# Patient Record
Sex: Female | Born: 1950 | Race: White | Hispanic: No | Marital: Married | Smoking: Former smoker
Health system: Southern US, Community
[De-identification: ages and names within clinical notes are randomized; demographics above are authoritative.]

## PROBLEM LIST (undated history)

## (undated) ENCOUNTER — Emergency Department: Discharge status: Absent without leave | Payer: Self-pay

## (undated) DIAGNOSIS — J189 Pneumonia, unspecified organism: Secondary | ICD-10-CM

## (undated) DIAGNOSIS — K5792 Diverticulitis of intestine, part unspecified, without perforation or abscess without bleeding: Secondary | ICD-10-CM

## (undated) DIAGNOSIS — K589 Irritable bowel syndrome without diarrhea: Secondary | ICD-10-CM

## (undated) DIAGNOSIS — D8989 Other specified disorders involving the immune mechanism, not elsewhere classified: Secondary | ICD-10-CM

## (undated) DIAGNOSIS — K449 Diaphragmatic hernia without obstruction or gangrene: Secondary | ICD-10-CM

## (undated) DIAGNOSIS — K219 Gastro-esophageal reflux disease without esophagitis: Secondary | ICD-10-CM

## (undated) DIAGNOSIS — I7090 Unspecified atherosclerosis: Secondary | ICD-10-CM

## (undated) DIAGNOSIS — J40 Bronchitis, not specified as acute or chronic: Secondary | ICD-10-CM

## (undated) DIAGNOSIS — M810 Age-related osteoporosis without current pathological fracture: Secondary | ICD-10-CM

## (undated) HISTORY — DX: Gastro-esophageal reflux disease without esophagitis: K21.9

## (undated) HISTORY — DX: Unspecified atherosclerosis: I70.90

## (undated) HISTORY — DX: Age-related osteoporosis without current pathological fracture: M81.0

## (undated) HISTORY — PX: TUBAL LIGATION: SHX77

## (undated) HISTORY — DX: Irritable bowel syndrome, unspecified: K58.9

## (undated) HISTORY — DX: Diaphragmatic hernia without obstruction or gangrene: K44.9

## (undated) HISTORY — DX: Other specified disorders involving the immune mechanism, not elsewhere classified: D89.89

## (undated) HISTORY — DX: Diverticulitis of intestine, part unspecified, without perforation or abscess without bleeding: K57.92

---

## 1998-04-02 ENCOUNTER — Other Ambulatory Visit: Admission: RE | Admit: 1998-04-02 | Discharge: 1998-04-02 | Payer: Self-pay | Admitting: Obstetrics & Gynecology

## 1999-05-08 ENCOUNTER — Other Ambulatory Visit: Admission: RE | Admit: 1999-05-08 | Discharge: 1999-05-08 | Payer: Self-pay | Admitting: Obstetrics & Gynecology

## 1999-09-23 ENCOUNTER — Other Ambulatory Visit: Admission: RE | Admit: 1999-09-23 | Discharge: 1999-09-23 | Payer: Self-pay | Admitting: Obstetrics & Gynecology

## 1999-11-05 ENCOUNTER — Other Ambulatory Visit: Admission: RE | Admit: 1999-11-05 | Discharge: 1999-11-05 | Payer: Self-pay | Admitting: Obstetrics & Gynecology

## 1999-11-05 ENCOUNTER — Encounter (INDEPENDENT_AMBULATORY_CARE_PROVIDER_SITE_OTHER): Payer: Self-pay | Admitting: Specialist

## 2000-05-13 ENCOUNTER — Other Ambulatory Visit: Admission: RE | Admit: 2000-05-13 | Discharge: 2000-05-13 | Payer: Self-pay | Admitting: Obstetrics & Gynecology

## 2001-06-16 ENCOUNTER — Other Ambulatory Visit: Admission: RE | Admit: 2001-06-16 | Discharge: 2001-06-16 | Payer: Self-pay | Admitting: Obstetrics & Gynecology

## 2002-10-24 ENCOUNTER — Other Ambulatory Visit: Admission: RE | Admit: 2002-10-24 | Discharge: 2002-10-24 | Payer: Self-pay | Admitting: Obstetrics & Gynecology

## 2003-11-07 ENCOUNTER — Other Ambulatory Visit: Admission: RE | Admit: 2003-11-07 | Discharge: 2003-11-07 | Payer: Self-pay | Admitting: Obstetrics & Gynecology

## 2004-12-25 ENCOUNTER — Other Ambulatory Visit: Admission: RE | Admit: 2004-12-25 | Discharge: 2004-12-25 | Payer: Self-pay | Admitting: Obstetrics & Gynecology

## 2005-01-09 ENCOUNTER — Encounter: Payer: Self-pay | Admitting: Pulmonary Disease

## 2005-01-09 ENCOUNTER — Encounter: Admission: RE | Admit: 2005-01-09 | Discharge: 2005-01-09 | Payer: Self-pay | Admitting: Orthopedic Surgery

## 2005-06-10 ENCOUNTER — Encounter: Payer: Self-pay | Admitting: Pulmonary Disease

## 2005-07-16 ENCOUNTER — Ambulatory Visit: Payer: Self-pay | Admitting: Pulmonary Disease

## 2005-07-18 ENCOUNTER — Ambulatory Visit (HOSPITAL_COMMUNITY): Admission: RE | Admit: 2005-07-18 | Discharge: 2005-07-18 | Payer: Self-pay | Admitting: Pulmonary Disease

## 2005-08-08 ENCOUNTER — Ambulatory Visit: Payer: Self-pay | Admitting: Pulmonary Disease

## 2005-08-12 ENCOUNTER — Encounter: Payer: Self-pay | Admitting: Pulmonary Disease

## 2005-08-12 ENCOUNTER — Ambulatory Visit (HOSPITAL_COMMUNITY): Admission: RE | Admit: 2005-08-12 | Discharge: 2005-08-12 | Payer: Self-pay | Admitting: Pulmonary Disease

## 2005-08-29 ENCOUNTER — Ambulatory Visit: Payer: Self-pay | Admitting: Pulmonary Disease

## 2005-11-18 ENCOUNTER — Ambulatory Visit: Payer: Self-pay | Admitting: Internal Medicine

## 2005-12-02 ENCOUNTER — Ambulatory Visit: Payer: Self-pay | Admitting: Pulmonary Disease

## 2006-02-17 ENCOUNTER — Ambulatory Visit: Payer: Self-pay | Admitting: Pulmonary Disease

## 2006-02-19 ENCOUNTER — Encounter: Payer: Self-pay | Admitting: Pulmonary Disease

## 2006-03-13 ENCOUNTER — Encounter: Payer: Self-pay | Admitting: Pulmonary Disease

## 2006-12-07 DIAGNOSIS — J984 Other disorders of lung: Secondary | ICD-10-CM | POA: Insufficient documentation

## 2006-12-07 DIAGNOSIS — R071 Chest pain on breathing: Secondary | ICD-10-CM | POA: Insufficient documentation

## 2006-12-07 DIAGNOSIS — J309 Allergic rhinitis, unspecified: Secondary | ICD-10-CM | POA: Insufficient documentation

## 2007-02-02 ENCOUNTER — Ambulatory Visit: Payer: Self-pay | Admitting: Pulmonary Disease

## 2007-02-02 DIAGNOSIS — J479 Bronchiectasis, uncomplicated: Secondary | ICD-10-CM

## 2008-05-25 ENCOUNTER — Ambulatory Visit: Payer: Self-pay | Admitting: Pulmonary Disease

## 2010-03-03 LAB — CONVERTED CEMR LAB
Rhuematoid fact SerPl-aCnc: <20 intl units/mL — ABNORMAL LOW (ref 0.0–20.0)
Sed Rate: 18 mm/hr (ref 0–25)

## 2010-06-21 NOTE — Assessment & Plan Note (Signed)
[facility]                               PULMONARY OFFICE NOTE   Alexandra, Rodriguez                      MRN:          [id number]  DATE:11/18/2005                            DOB:          09/26/1950    HISTORY OF PRESENT ILLNESS:  Patient is a 60 year old white female patient  of Dr. Shelle Iron who has a history of bronchiectasis, presents for an acute  office visit.  Patient complains of 2 week history of upper respiratory  symptoms with nasal congestion, postnasal drip, cough and chest tightness.  Patient denies any hemoptysis, orthopnea, PND or recent travel.  Patient did  have a similar episode in July of this year  and received a 10 day course of  Augmentin and that improved symptoms.  A followup CT chest at that time  showed improved area of right middle lobe consolidation and stable lung  nodules.   PAST MEDICAL HISTORY:  Reviewed.   CURRENT MEDICATIONS:  Reviewed.   PHYSICAL EXAMINATION:  GENERAL;  Patient is a pleasant female in no acute  distress.  VITAL SIGNS:  She is afebrile with stable vital signs.  O2 saturation is  100% on room air.  HEENT:  Nasal mucosa with mild erythema, nontender sinuses, posterior  pharynx clear.  NECK:  Supple with no adenopathy.  LUNGS:  Sounds are clear to auscultation bilaterally without any wheezes or  crackles.  CARDIAC:  Regular rate and rhythm.  ABDOMEN:  Soft.  EXTREMITIES:  Warm without any edema.   IMPRESSION/PLAN:  Acute upper respiratory infection with a mild  bronchiectatic flare.  Patient will be given Omnicef x10 days, Mucinex DM  twice a day,  Endal HD as needed for cough.  Patient may use Motrin as  needed. Patient is to return with Dr. Shelle Iron in 2 weeks or sooner if needed.      ______________________________  Rubye Oaks, NP    ______________________________  Barbaraann Share, MD,FCCP     TP/MedQ  DD:  11/18/2005  DT:  11/19/2005  Job #:  [id number]

## 2011-01-18 ENCOUNTER — Emergency Department
Admission: EM | Admit: 2011-01-18 | Discharge: 2011-01-18 | Disposition: A | Discharge status: Home / Self Care | Payer: BC Managed Care – PPO | Source: Home / Self Care | Attending: Emergency Medicine | Admitting: Emergency Medicine

## 2011-01-18 DIAGNOSIS — R05 Cough: Secondary | ICD-10-CM

## 2011-01-18 DIAGNOSIS — J209 Acute bronchitis, unspecified: Secondary | ICD-10-CM

## 2011-01-18 HISTORY — DX: Bronchitis, not specified as acute or chronic: J40

## 2011-01-18 MED ORDER — GUAIFENESIN-CODEINE 100-10 MG/5ML PO SYRP: 5.0000 mL | ORAL_SOLUTION | Freq: Four times a day (QID) | ORAL | Status: AC | PRN

## 2011-01-18 MED ORDER — AZITHROMYCIN 250 MG PO TABS: ORAL_TABLET | ORAL | Status: AC

## 2011-01-18 NOTE — ED Notes (Signed)
Cough on and off x 6 weeks, states she has a hx of bronchitis

## 2011-01-18 NOTE — ED Provider Notes (Signed)
History     CSN: [id number] Arrival date & time: No admission date for patient encounter.   First MD Initiated Contact with Patient 01/18/11 1502      No chief complaint on file.   (Consider location/radiation/quality/duration/timing/severity/associated sxs/prior treatment) HPI Charonda is a 60 y.o. female who complains of onset of cold symptoms off and on for 6 weeks. No sore throat + cough No pleuritic pain No wheezing No nasal congestion No post-nasal drainage No sinus pain/pressure + chest congestion No itchy/red eyes No earache No hemoptysis No SOB No chills/sweats No fever No nausea No vomiting No abdominal pain No diarrhea No skin rashes + fatigue No myalgias No headache    No past medical history on file.  No past surgical history on file.  No family history on file.  History  Substance Use Topics  . Smoking status: Not on file  . Smokeless tobacco: Not on file  . Alcohol Use: Not on file    OB History    No data available      Review of Systems  All other systems reviewed and are negative.    Allergies  Review of patient's allergies indicates not on file.  Home Medications  No current outpatient prescriptions on file.  There were no vitals taken for this visit.  Physical Exam  Nursing note and vitals reviewed. Constitutional: She is oriented to person, place, and time. She appears well-developed and well-nourished.  HENT:  Head: Normocephalic and atraumatic.  Right Ear: Tympanic membrane, external ear and ear canal normal.  Left Ear: Tympanic membrane, external ear and ear canal normal.  Mouth/Throat: No oropharyngeal exudate, posterior oropharyngeal edema or posterior oropharyngeal erythema.  Eyes: No scleral icterus.  Neck: Neck supple.  Cardiovascular: Regular rhythm and normal heart sounds.   Pulmonary/Chest: Effort normal and breath sounds normal. No respiratory distress.  Neurological: She is alert and oriented to person,  place, and time.  Skin: Skin is warm and dry.  Psychiatric: She has a normal mood and affect. Her speech is normal.    ED Course  Procedures (including critical care time)  Labs Reviewed - No data to display No results found.   1. Cough   2. Acute bronchitis       MDM  1)  Take the prescribed antibiotic as instructed. 2)  Use nasal saline solution (over the counter) at least 3 times a day. 3)  Use over the counter decongestants like Zyrtec-D every 12 hours as needed to help with congestion.  If you have hypertension, do not take medicines with sudafed.  4)  Can take tylenol every 6 hours or motrin every 8 hours for pain or fever. 5)  Follow up with your primary doctor if no improvement in 5-7 days, sooner if increasing pain, fever, or new symptoms.      Lily Kocher, MD 01/18/11 [phone]

## 2011-02-21 ENCOUNTER — Encounter: Payer: Self-pay | Admitting: *Deleted

## 2011-02-21 ENCOUNTER — Emergency Department
Admission: EM | Admit: 2011-02-21 | Discharge: 2011-02-21 | Disposition: A | Discharge status: Home / Self Care | Payer: BC Managed Care – PPO | Source: Home / Self Care | Attending: Emergency Medicine | Admitting: Emergency Medicine

## 2011-02-21 DIAGNOSIS — J0100 Acute maxillary sinusitis, unspecified: Secondary | ICD-10-CM

## 2011-02-21 MED ORDER — AMOXICILLIN-POT CLAVULANATE 875-125 MG PO TABS: 1.0000 | ORAL_TABLET | Freq: Two times a day (BID) | ORAL | Status: AC

## 2011-02-21 MED ORDER — PROMETHAZINE-DM 6.25-15 MG/5ML PO SYRP: ORAL_SOLUTION | ORAL | Status: AC

## 2011-02-21 MED ORDER — FLUTICASONE PROPIONATE 50 MCG/ACT NA SUSP: NASAL | Status: DC

## 2011-02-21 NOTE — ED Notes (Signed)
Pt c/o non producitve cough, nasal congestion, and sinus/ear pressure x 2 days. She has taken an old rx of cough med and ASA.

## 2011-02-21 NOTE — ED Provider Notes (Signed)
History     CSN: [id number]  Arrival date & time 02/21/11  1545   First MD Initiated Contact with Patient 02/21/11 1617      Chief Complaint  Patient presents with  . Cough  . Nasal Congestion    (Consider location/radiation/quality/duration/timing/severity/associated sxs/prior treatment) The history is provided by the patient.  SINUSITIS  Onset: 3 days   Severity: Severe  Better with: Nothing  Symptoms Cough: yes, minimally productive Runny nose: yes Fever: yes   Highest Temp: 101 Sinus Pressure: yes  Ears Blocked: Yes, pressure Teeth Ache: yes  Frontal Headache: no  Second sickening: no   PMH Sinusitis or Recurrent OM: no  PMH Prior Sinus or Ear Surgery: no  Recent antibiotic usage (last 30 days): no  PMH of Diabetes or Immunocompromise: no    Red flags Change in mental state: no Change in vision: no Rash: no     Past Medical History  Diagnosis Date  . Bronchitis   . Bronchitis     Past Surgical History  Procedure Date  . Tubal ligation   . Tubal ligation     Family History  Problem Relation Age of Onset  . Thyroid disease Mother     History  Substance Use Topics  . Smoking status: Former Games developer  . Smokeless tobacco: Not on file  . Alcohol Use: Yes     2-3 per wk    OB History    Grav Para Term Preterm Abortions TAB SAB Ect Mult Living                  Review of Systems  Allergies  Review of patient's allergies indicates no known allergies.  Home Medications   Current Outpatient Rx  Name Route Sig Dispense Refill  . AMOXICILLIN-POT CLAVULANATE 875-125 MG PO TABS Oral Take 1 tablet by mouth every 12 (twelve) hours. Take for 10 days. Take with food. 20 tablet 0  . FLUTICASONE PROPIONATE 50 MCG/ACT NA SUSP  1 or 2 sprays each nostril twice a day 16 g 0  . PROMETHAZINE-DM 6.25-15 MG/5ML PO SYRP  5 ml po every 4-6 hours as needed for cough 118 mL 0    BP 149/87  Pulse 102  Temp(Src) 99.4 F (37.4 C) (Oral)  Resp 18  Ht 5\' 7"   (1.702 m)  Wt 136 lb 12 oz (62.029 kg)  BMI 21.42 kg/m2  SpO2 99%  Physical Exam  Nursing note and vitals reviewed. Constitutional: She is oriented to person, place, and time. She appears well-developed and well-nourished. No distress.  HENT:  Head: Normocephalic and atraumatic.  Right Ear: Tympanic membrane, external ear and ear canal normal.  Left Ear: Tympanic membrane, external ear and ear canal normal.  Nose: Mucosal edema and rhinorrhea present. Right sinus exhibits maxillary sinus tenderness. Left sinus exhibits maxillary sinus tenderness.  Mouth/Throat: Oropharynx is clear and moist. No oral lesions. No oropharyngeal exudate.  Eyes: Right eye exhibits no discharge. Left eye exhibits no discharge. No scleral icterus.  Neck: Neck supple.  Cardiovascular: Normal rate, regular rhythm and normal heart sounds.   Pulmonary/Chest: Effort normal and breath sounds normal. She has no wheezes. She has no rales.  Lymphadenopathy:    She has no cervical adenopathy.  Neurological: She is alert and oriented to person, place, and time.  Skin: Skin is warm and dry.    ED Course  Procedures (including critical care time)  Labs Reviewed - No data to display No results found.   1. Acute  maxillary sinusitis       MDM  See detailed Instructions in AVS, which were given to patient. Verbal instructions also given. Questions invited and answered. Other symptomatic care discussed        Lonell Face, MD 02/21/11 [phone]

## 2011-06-28 ENCOUNTER — Emergency Department
Admission: EM | Admit: 2011-06-28 | Discharge: 2011-06-28 | Disposition: A | Discharge status: Home / Self Care | Payer: BC Managed Care – PPO | Source: Home / Self Care

## 2011-06-28 DIAGNOSIS — A770 Spotted fever due to Rickettsia rickettsii: Secondary | ICD-10-CM

## 2011-06-28 DIAGNOSIS — W57XXXA Bitten or stung by nonvenomous insect and other nonvenomous arthropods, initial encounter: Secondary | ICD-10-CM

## 2011-06-28 MED ORDER — DOXYCYCLINE HYCLATE 100 MG PO TABS: 100.0000 mg | ORAL_TABLET | Freq: Two times a day (BID) | ORAL | Status: AC

## 2011-06-28 NOTE — ED Provider Notes (Signed)
History     CSN: [id number]  Arrival date & time 06/28/11  1739   None     No chief complaint on file.  HPI Comments: Pt bit by tick 2 weeks ago.  Has developed localized rash.  Also some mild headache.  Fever:no Rash:no Myalgia:yes Nausea/Vomiting: no Malaise:yes Symptoms have been present for approx 5 days Has been using old rx of amoxicillin and asa for symptomatic treatment with minimal change in sxs.    The history is provided by the patient.    Past Medical History  Diagnosis Date  . Bronchitis   . Bronchitis     Past Surgical History  Procedure Date  . Tubal ligation   . Tubal ligation     Family History  Problem Relation Age of Onset  . Thyroid disease Mother     History  Substance Use Topics  . Smoking status: Former Games developer  . Smokeless tobacco: Not on file  . Alcohol Use: Yes     2-3 per wk    OB History    Grav Para Term Preterm Abortions TAB SAB Ect Mult Living                  Review of Systems  All other systems reviewed and are negative.    Allergies  Review of patient's allergies indicates no known allergies.  Home Medications   Current Outpatient Rx  Name Route Sig Dispense Refill  . FLUTICASONE PROPIONATE 50 MCG/ACT NA SUSP  1 or 2 sprays each nostril twice a day 16 g 0    There were no vitals taken for this visit.  Physical Exam  Constitutional: She appears well-developed and well-nourished.  HENT:  Head: Normocephalic and atraumatic.  Right Ear: External ear normal.  Left Ear: External ear normal.  Mouth/Throat: Oropharynx is clear and moist.  Eyes: Conjunctivae are normal. Pupils are equal, round, and reactive to light.  Neck: Normal range of motion. Neck supple.  Cardiovascular: Normal rate and regular rhythm.   Pulmonary/Chest: Effort normal and breath sounds normal.  Abdominal: Soft. Bowel sounds are normal.  Skin:          2 erythematous macules <0.5 cm near sites of previous tick exposure.  No bullseye  morphology    ED Course  Procedures (including critical care time)  Labs Reviewed - No data to display No results found.   No diagnosis found.    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  Will obtain RMSF titers, CBC, CMET Start on doxycycline Follow up with PCP/ [facility] in 1-2 weeks.  Handout given.  Infectious red flags discussed.     The patient and/or caregiver has been counseled thoroughly with regard to treatment plan and/or medications prescribed including dosage, schedule, interactions, rationale for use, and possible side effects and they verbalize understanding. Diagnoses and expected course of recovery discussed and will return if not improved as expected or if the condition worsens. Patient and/or caregiver verbalized understanding.             Floydene Flock, MD 06/28/11 [phone]

## 2011-06-28 NOTE — Discharge Instructions (Signed)
[place] Spotted Fever °[place] Spotted Fever (RMSF) is the oldest known tick-borne disease of people in the [place]. This disease was named because it was first described among people in the [place] area who had an illness characterized by a rash with red-purple-black spots. This disease is caused by a rickettsia (Rickettsia rickettsii), a bacteria carried by the tick. °The [place] wood tick and the [place] dog tick, acquire and transmit the RMSF bacteria (pictures NOT actual size). When a larval, nymphal or adult tick feeds on an infected rodent or larger animal, the tick can become infected. Infected adult ticks then feed on people who may then get RMSF. The tick transmits the disease to humans during a prolonged period of feeding that lasts many hours, days or even a couple weeks. The bite is painless and frequently goes unnoticed. An infected female tick may also pass the rickettsial bacteria to her eggs that then may mature to be infected adult ticks. °The rickettsia that causes RMSF can also get into a person's body through damaged skin. A tick bite is not necessary. People can get RMSF if they crush a tick and get it's blood or body fluids on their skin through a small cut or sore.  °DIAGNOSIS °Diagnosis is made by laboratory tests.  °TREATMENT °Treatment is with antibiotics (medications that kill rickettsia and other bacteria). Immediate treatment usually prevents death. °GEOGRAPHIC RANGE °This disease was reported only in the [place] until 1931. RMSF has more recently been described among individuals in all states except [place], [place] and [place]. The highest reported incidences of RMSF now occur among residents of [place], [place], [place] and the [place]. °TIME OF YEAR  °Most cases are diagnosed during late spring and summer when ticks are most active. However, especially in the warmer southern states, a few cases occur during the winter. °SYMPTOMS   °· Symptoms of RMSF begin from 2 to 14 days after a tick bite. The most common early symptoms are fever, muscle aches and headache followed by nausea (feeling sick to your stomach) or vomiting.  °· The RMSF rash is typically delayed until 3 or more days after symptom onset, and eventually develops in 9 of 10 infected patients by the 5th day of illness.  °If the disease is not treated it can cause death. If you get a fever, headache, muscle aches, rash, nausea or vomiting within 2 weeks of a possible tick bite or exposure you should see your caregiver immediately. °PREVENTION °Ticks prefer to hide in shady, moist ground litter. They can often be found above the ground clinging to tall grass, brush, shrubs and low tree branches. They also inhabit lawns and gardens, especially at the edges of woodlands and around old stone walls. Within the areas where ticks generally live, no naturally vegetated area can be considered completely free of infected ticks. The best precaution against RMSF is to avoid contact with soil, leaf litter and vegetation as much as possible in tick infested areas. For those who enjoy gardening or walking in their yards, clear brush and mow tall grass around houses and at the edges of gardens. This may help reduce the tick population in the immediate area. Applications of chemical insecticides by a licensed professional in the spring (late May) and Fall (September) will also control ticks, especially in heavily infested areas. Treatment will never get rid of all the ticks. Getting rid of small animal populations that host ticks will also decrease the tick population. When working in   the garden, pruning shrubs, or handling soil and vegetation, wear light-colored protective clothing and gloves. Spot-check often to prevent ticks from reaching the skin. Ticks cannot jump or fly. They will not drop from an above-ground perch onto a passing animal. Once a tick gains access to human skin it climbs upward  until it reaches a more protected area. For example, the back of the knee, groin, navel, armpit, ears or nape of the neck. It then begins the slow process of embedding itself in the skin. °Campers, hikers, field workers, and others who spend time in wooded, brushy or tall grassy areas can avoid exposure to ticks by using the following precautions: °· Wear light-colored clothing with a tight weave to spot ticks more easily and prevent contact with the skin.  °· Wear long pants tucked into socks, long-sleeved shirts tucked into pants and enclosed shoes or boots along with insect repellent.  °· Spray clothes with insect repellent containing either DEET or Permethrin. Only DEET can be used on exposed skin. Follow the manufacturer's directions carefully.  °· Wear a hat and keep long hair pulled back.  °· Stay on cleared, well-worn trails whenever possible.  °· Spot-check yourself and others often for the presence of ticks on clothes. If you find one, there are likely to be others. Check thoroughly.  °· Remove clothes after leaving tick-infested areas. If possible, wash them to eliminate any unseen ticks. Check yourself, your children and any pets from head to toe for the presence of ticks.  °· Shower and shampoo.  °You can greatly reduce your chances of contracting RMSF if you remove attached ticks as soon as possible. Regular checks of the body, including all body sites covered by hair (head, armpits, genitals), allow removal of the tick before rickettsial transmission. To remove an attached tick, use a forceps or tweezers to detach the intact tick without leaving mouth parts in the skin. The tick bite wound should be cleansed after tick removal. °Remember the most common symptoms of RMSF are fever, muscle aches, headache and nausea or vomiting with a later onset of rash. If you get these symptoms after a tick bite and while living in an area where RMSF is found, RMSF should be suspected. If the disease is not treated,  it can cause death. See your caregiver immediately if you get these symptoms. Do this even if not aware of a tick bite. °Document Released: 05/04/2000 Document Revised: 01/09/2011 Document Reviewed: 12/25/2008 °Exit[employer]® Patient Information ©2012 [employer]. °

## 2011-06-28 NOTE — ED Notes (Signed)
Patient states she found a tick on her May 11th. She started having headaches and body aches on the 21st of May and also noticed red areas under her arm and left hip.

## 2011-06-29 LAB — CBC
HCT: 38.3 % (ref 36.0–46.0)
Hemoglobin: 12.6 g/dL (ref 12.0–15.0)
MCH: 26.4 pg (ref 26.0–34.0)
MCV: 80.3 fL (ref 78.0–100.0)
Platelets: 264 10*3/uL (ref 150–400)
RBC: 4.77 MIL/uL (ref 3.87–5.11)
WBC: 4.6 10*3/uL (ref 4.0–10.5)

## 2011-06-29 LAB — COMPREHENSIVE METABOLIC PANEL
ALT: 21 U/L (ref 0–35)
CO2: 30 mEq/L (ref 19–32)
Calcium: 9.6 mg/dL (ref 8.4–10.5)
Chloride: 103 mEq/L (ref 96–112)
Creat: 0.84 mg/dL (ref 0.50–1.10)
Glucose, Bld: 131 mg/dL — ABNORMAL HIGH (ref 70–99)
Total Bilirubin: 0.4 mg/dL (ref 0.3–1.2)
Total Protein: 7.3 g/dL (ref 6.0–8.3)

## 2011-06-29 NOTE — ED Provider Notes (Signed)
Agree with exam, assessment, and plan.   Lattie Haw, MD 06/29/11 1240

## 2011-06-30 ENCOUNTER — Telehealth: Payer: Self-pay | Admitting: Family Medicine

## 2013-04-09 ENCOUNTER — Emergency Department
Admission: EM | Admit: 2013-04-09 | Discharge: 2013-04-09 | Disposition: A | Discharge status: Home / Self Care | Payer: BC Managed Care – PPO | Source: Home / Self Care | Attending: Family Medicine | Admitting: Family Medicine

## 2013-04-09 ENCOUNTER — Encounter: Payer: Self-pay | Admitting: Emergency Medicine

## 2013-04-09 DIAGNOSIS — J069 Acute upper respiratory infection, unspecified: Secondary | ICD-10-CM

## 2013-04-09 MED ORDER — BENZONATATE 200 MG PO CAPS: 200.0000 mg | ORAL_CAPSULE | Freq: Every day | ORAL | Status: DC

## 2013-04-09 MED ORDER — AMOXICILLIN 875 MG PO TABS: 875.0000 mg | ORAL_TABLET | Freq: Two times a day (BID) | ORAL | Status: DC

## 2013-04-09 NOTE — ED Provider Notes (Signed)
CSN: [id number]     Arrival date & time 04/09/13  1334 History   First MD Initiated Contact with Patient 04/09/13 1430     Chief Complaint  Patient presents with  . Sinusitis      HPI Comments: Patient reports that she became fatigued and developed myalgias about one week ago.  Two days later she developed sinus congestion and non-productive cough.  Her cough has increased and awakens her at night.  She had left-over amoxicillin from a tooth infection, and began taking 500mg , two caps BID, five days ago.  The history is provided by the patient.    Past Medical History  Diagnosis Date  . Bronchitis   . Bronchitis    Past Surgical History  Procedure Laterality Date  . Tubal ligation    . Tubal ligation     Family History  Problem Relation Age of Onset  . Thyroid disease Mother   . Heart failure Father    History  Substance Use Topics  . Smoking status: Former Games developer  . Smokeless tobacco: Not on file  . Alcohol Use: Yes     Comment: 2-3 per wk   OB History   Grav Para Term Preterm Abortions TAB SAB Ect Mult Living                 Review of Systems No sore throat + cough No pleuritic pain No wheezing + nasal congestion + post-nasal drainage No sinus pain/pressure + red eyes No earache No hemoptysis No SOB No fever, + chills No nausea No vomiting No abdominal pain No diarrhea No urinary symptoms No skin rash + fatigue No myalgias No headache Used OTC meds without relief  Allergies  Review of patient's allergies indicates no known allergies.  Home Medications   Current Outpatient Rx  Name  Route  Sig  Dispense  Refill  . amoxicillin (AMOXIL) 875 MG tablet   Oral   Take 1 tablet (875 mg total) by mouth 2 (two) times daily.   14 tablet   0   . benzonatate (TESSALON) 200 MG capsule   Oral   Take 1 capsule (200 mg total) by mouth at bedtime. Take as needed for cough   12 capsule   0   . EXPIRED: fluticasone (FLONASE) 50 MCG/ACT nasal spray     1 or 2 sprays each nostril twice a day   16 g   0    BP 137/55  Pulse 78  Temp(Src) 98.1 F (36.7 C) (Oral)  Resp 16  Ht 5\' 7"  (1.702 m)  Wt 137 lb 12 oz (62.483 kg)  BMI 21.57 kg/m2  SpO2 100% Physical Exam Nursing notes and Vital Signs reviewed. Appearance:  Patient appears healthy, stated age, and in no acute distress Eyes:  Pupils are equal, round, and reactive to light and accomodation.  Extraocular movement is intact.  Conjunctivae are slightly injected  Ears:  Canals normal.  Tympanic membranes normal.  Nose:  Mildly congested turbinates.  No sinus tenderness.    Pharynx:  Normal Neck:  Supple.   Non-tender prominient posterior nodes are palpated bilaterally  Lungs:  Clear to auscultation.  Breath sounds are equal.  Heart:  Regular rate and rhythm without murmurs, rubs, or gallops.  Abdomen:  Nontender without masses or hepatosplenomegaly.  Bowel sounds are present.  No CVA or flank tenderness.  Extremities:  No edema.  No calf tenderness Skin:  No rash present.   ED Course  Procedures  none  MDM   1. Acute upper respiratory infections of unspecified site; suspect viral URI    Will continue amoxicillin.  Prescription written for Benzonatate [facility](Tessalon) to take at bedtime for night-time cough.  Take plain Mucinex (1200 mg guaifenesin) twice daily for cough and congestion.  May add Sudafed for sinus congestion.   Increase fluid intake, rest. May use refrigerated lubricating eye drops for eye redness. May use Afrin nasal spray (or generic oxymetazoline) twice daily for about 5 days.  Also recommend using saline nasal spray several times daily and saline nasal irrigation (AYR is a common brand).  Use Flonase after using Afrin spray and saline irrigation Stop all antihistamines for now, and other non-prescription cough/cold preparations. Follow-up with family doctor if not improving 7 to 10 days.     Lattie HawStephen A Linnette Panella, MD 04/11/13 [phone]

## 2013-04-09 NOTE — Discharge Instructions (Signed)
Take plain Mucinex (1200 mg guaifenesin) twice daily for cough and congestion.  May add Sudafed for sinus congestion.   Increase fluid intake, rest. May use refrigerated lubricating eye drops for eye redness. May use Afrin nasal spray (or generic oxymetazoline) twice daily for about 5 days.  Also recommend using saline nasal spray several times daily and saline nasal irrigation (AYR is a common brand).  Use Flonase after using Afrin spray and saline irrigation Stop all antihistamines for now, and other non-prescription cough/cold preparations. Follow-up with family doctor if not improving 7 to 10 days.

## 2013-04-09 NOTE — ED Notes (Signed)
C/o cough, rhinitis, sinus problems, and red eyes x 5 days. Taking Amoxicillin that she had left over from tooth infection, eyedrops and Flonase.

## 2013-04-12 ENCOUNTER — Emergency Department (INDEPENDENT_AMBULATORY_CARE_PROVIDER_SITE_OTHER): Payer: BC Managed Care – PPO

## 2013-04-12 ENCOUNTER — Encounter: Payer: Self-pay | Admitting: Emergency Medicine

## 2013-04-12 ENCOUNTER — Telehealth: Payer: Self-pay | Admitting: *Deleted

## 2013-04-12 ENCOUNTER — Emergency Department
Admission: EM | Admit: 2013-04-12 | Discharge: 2013-04-12 | Disposition: A | Discharge status: Home / Self Care | Payer: BC Managed Care – PPO | Source: Home / Self Care | Attending: Emergency Medicine | Admitting: Emergency Medicine

## 2013-04-12 DIAGNOSIS — J189 Pneumonia, unspecified organism: Secondary | ICD-10-CM

## 2013-04-12 DIAGNOSIS — R918 Other nonspecific abnormal finding of lung field: Secondary | ICD-10-CM

## 2013-04-12 MED ORDER — CEFTRIAXONE SODIUM 1 G IJ SOLR
1.0000 g | INTRAMUSCULAR | Status: AC
  Administered 2013-04-12: 1 g via INTRAMUSCULAR

## 2013-04-12 MED ORDER — LEVOFLOXACIN 500 MG PO TABS: ORAL_TABLET | ORAL | Status: DC

## 2013-04-12 NOTE — ED Provider Notes (Signed)
CSN: [id number]     Arrival date & time 04/12/13  1520 History   First MD Initiated Contact with Patient 04/12/13 1552     Chief Complaint  Patient presents with  . Cough  . Shortness of Breath   HPI URI HISTORY  Lakresha is a 63 y.o. female who complains of onset of cough/URI symptoms for 12 days.  Now progressively worse with a vague, pleuritic right lateral chest pain. Developed fever and chills today.  Has been using over-the-counter treatment Mucinex which helps a little bit. Tessalon Perles were no help.   + chills/sweats +  Fever  +  Nasal congestion +  Discolored Post-nasal drainage No sinus pain/pressure No sore throat  +  Cough, productive of yellow sputum No wheezing + chest congestion No hemoptysis Vague feeling of shortness of breath with exertion + R pleuritic pain No exertional chest pain  No itchy/red eyes No earache  No nausea No vomiting No abdominal pain No diarrhea  No skin rashes +  Fatigue No myalgias No headache   Past Medical History  Diagnosis Date  . Bronchitis   . Bronchitis    Past Surgical History  Procedure Laterality Date  . Tubal ligation    . Tubal ligation     Family History  Problem Relation Age of Onset  . Thyroid disease Mother   . Heart failure Father    History  Substance Use Topics  . Smoking status: Former Games developer  . Smokeless tobacco: Not on file  . Alcohol Use: Yes     Comment: 2-3 per wk   OB History   Grav Para Term Preterm Abortions TAB SAB Ect Mult Living                 Review of Systems  Gastrointestinal: Negative for nausea and vomiting.       Has decreased appetite, but tolerating by mouth  All other systems reviewed and are negative.    Allergies  Review of patient's allergies indicates no known allergies.  Home Medications   Current Outpatient Rx  Name  Route  Sig  Dispense  Refill  . EXPIRED: fluticasone (FLONASE) 50 MCG/ACT nasal spray      1 or 2 sprays each nostril twice a  day   16 g   0   . levofloxacin (LEVAQUIN) 500 MG tablet      Take 1 tablet daily X 10 days.   10 tablet   0    BP 125/82  Pulse 103  Temp(Src) 100.2 F (37.9 C) (Oral)  Resp 18  Ht 5\' 7"  (1.702 m)  Wt 136 lb (61.689 kg)  BMI 21.30 kg/m2  SpO2 99% Physical Exam  Nursing note and vitals reviewed. Constitutional: She is oriented to person, place, and time. She appears well-developed and well-nourished.  Non-toxic appearance. No distress.  She appears uncomfortable, acutely ill, but no acute cardiorespiratory distress. Pulse ox 99% on room air  HENT:  Head: Normocephalic and atraumatic.  Right Ear: Tympanic membrane normal.  Left Ear: Tympanic membrane normal.  Nose: Nose normal.  Mouth/Throat: Oropharynx is clear and moist. No oropharyngeal exudate.  Eyes: Right eye exhibits no discharge. Left eye exhibits no discharge. No scleral icterus.  Neck: Neck supple.  Cardiovascular: Normal rate, regular rhythm and normal heart sounds.   Pulmonary/Chest: No respiratory distress. She has no wheezes. She has rhonchi. She has no rales. She exhibits no tenderness.  Question of mild bibasilar crackles, right greater than left. She splints her  self to avoid taking a deep breath, with reported pain right lateral chest when trying to take a deep breath  Lymphadenopathy:    She has no cervical adenopathy.  Neurological: She is alert and oriented to person, place, and time.  Skin: Skin is warm and dry. No rash noted.  Psychiatric: She has a normal mood and affect.    ED Course  Procedures (including critical care time) Labs Review Labs Reviewed - No data to display Imaging Review Dg Chest 2 View  04/12/2013   CLINICAL DATA Cough, right pleuritic pain, shortness of breath and fever  EXAM CHEST  2 VIEW  COMPARISON Chest x-ray of 05/25/2008  FINDINGS There is parenchymal opacity in the right mid upper lung field most likely representing pneumonia. However, recommend followup chest x-ray to  ensure clearing. Otherwise the lungs are clear and well aerated. No effusion is seen. Mediastinal contours appear normal and heart size is normal. No bony abnormality is seen.  IMPRESSION Opacity in the right upper lung field most likely represents focal pneumonia. Recommend followup chest x-ray to ensure clearing.  SIGNATURE  Electronically Signed   By: Dwyane DeePaul  Barry M.D.   On: 04/12/2013 16:24     MDM   1. CAP (community acquired pneumonia)    Focal pneumonia right mid lung field. Pulse ox normal on room air. Pulse rate checked 92, regular. Vital signs stable. In my opinion, she can be treated as an outpatient with close followup. Treatment options discussed, as well as risks, benefits, alternatives. Patient voiced understanding and agreement with the following plans: Rocephin 1 g IM stat Levaquin 500 mg daily x10 days Other symptomatic care discussed. Follow up with your primary care physician or specialist within 7 days, or sooner if not improving, having worsening of symptoms, or new severe symptoms.  Precautions discussed. Red flags discussed.--Go to ER stat if any red flags Questions invited and answered. Patient voiced understanding and agreement.      Lajean Manesavid Massey, MD 04/12/13 [id number]

## 2013-04-12 NOTE — ED Notes (Signed)
Pt c/o RT rib area pain, SOB with exertion, and productive cough x 2/27 worse x 4 days. She denies fever until today.

## 2013-05-25 ENCOUNTER — Telehealth: Payer: Self-pay | Admitting: Pulmonary Disease

## 2013-05-25 NOTE — Telephone Encounter (Signed)
Called spoke with pt. She was DX w/ PNA in march and repeat CXR's done by PCP is not showing PNAa is clearing. She scheduled a new pt appt with MW on Friday since [facility]. She will have her PCP fax over her repeat CXR's for review for her OV visit. Nothing further needed

## 2013-05-27 ENCOUNTER — Other Ambulatory Visit (INDEPENDENT_AMBULATORY_CARE_PROVIDER_SITE_OTHER): Payer: BC Managed Care – PPO

## 2013-05-27 ENCOUNTER — Encounter: Payer: Self-pay | Admitting: Internal Medicine

## 2013-05-27 ENCOUNTER — Ambulatory Visit (INDEPENDENT_AMBULATORY_CARE_PROVIDER_SITE_OTHER): Payer: BC Managed Care – PPO | Admitting: Internal Medicine

## 2013-05-27 VITALS — BP 128/80 | HR 77 | Temp 97.8°F | Ht 67.0 in | Wt 138.2 lb

## 2013-05-27 DIAGNOSIS — J479 Bronchiectasis, uncomplicated: Secondary | ICD-10-CM

## 2013-05-27 DIAGNOSIS — Z23 Encounter for immunization: Secondary | ICD-10-CM

## 2013-05-27 LAB — CBC WITH DIFFERENTIAL/PLATELET
BASOS ABS: 0 10*3/uL (ref 0.0–0.1)
Basophils Relative: 0.7 % (ref 0.0–3.0)
EOS ABS: 0.1 10*3/uL (ref 0.0–0.7)
Eosinophils Relative: 1.2 % (ref 0.0–5.0)
HCT: 42.3 % (ref 36.0–46.0)
HEMOGLOBIN: 13.6 g/dL (ref 12.0–15.0)
LYMPHS ABS: 1.8 10*3/uL (ref 0.7–4.0)
LYMPHS PCT: 41.4 % (ref 12.0–46.0)
MCHC: 32.2 g/dL (ref 30.0–36.0)
MCV: 84.3 fl (ref 78.0–100.0)
MONO ABS: 0.6 10*3/uL (ref 0.1–1.0)
Monocytes Relative: 12.6 % — ABNORMAL HIGH (ref 3.0–12.0)
NEUTROS ABS: 1.9 10*3/uL (ref 1.4–7.7)
Neutrophils Relative %: 44.1 % (ref 43.0–77.0)
Platelets: 273 10*3/uL (ref 150.0–400.0)
RBC: 5.02 Mil/uL (ref 3.87–5.11)
RDW: 14.4 % (ref 11.5–14.6)
WBC: 4.4 10*3/uL — ABNORMAL LOW (ref 4.5–10.5)

## 2013-05-27 LAB — SEDIMENTATION RATE: Sed Rate: 17 mm/hr (ref 0–22)

## 2013-05-27 NOTE — Patient Instructions (Addendum)
Bronchiectasis =   you have scarring of your bronchial tubes which means that they don't function perfectly normally and mucus tends to pool in certain areas of your lung which can cause pneumonia and further scarring of your lung and bronchial tubes  Whenever you develop cough congestion take mucinex or mucinex dm > these will help keep the mucus loose and flowing but if your condition worsens you need to seek help immediately preferably here or somewhere inside the [facility] system to compare xrays ( worse = darker or bloody mucus or pain on breathing in)   mucinex = 1200 mg every hours as needed  Prevar 13 the last pneumonia shot you'll need   Please remember to go to the lab   department downstairs for your tests - we will call you with the results when they are available.     Please schedule a follow up office visit in 6 weeks, call sooner if needed with cxr on return

## 2013-05-27 NOTE — Progress Notes (Signed)
Subjective:    Patient ID: Alexandra Rodriguez, female    DOB: [birth year]   MRN: [id number]  HPI  [id number] yowf quit smoking 1984 with abn scans by Dr Jethro BolusGene [place] in 2005 > [facility]x [facility] and fine s need for any meds except for occ episodes of " bronchitis" > primary care eval and rx 100% response each time but in early march 2015 onset of sinus congestion/then R lower flank pain radiated R ant chest over sev days with pleuritic features dx as pna rx rocephin and levaquin x 18 days improved symptomatically  but rreferred 05/27/2013 to pulmonary clinic for eval of persistent infiltrates on cxr   05/27/2013 1st [place] [facility] office visit/ EMR eraWert  Chief Complaint  Patient presents with  . Pulmonary Consult    PNA since 04/2013 with no clearing.  Still having tightness/heayness in chest  min cough no purulent sputum and chest discomfort was a 10/10 and pain gone now only miniamlly tight tight with a deep insp maneuver "whenever breath deep" - feels best when takes mucinex / not on any inhalers.  Not limited by breathing from desired activities    No obvious other patterns in day to day or daytime variabilty or assoc chronic cough or cp or chest tightness, subjective wheeze overt sinus or hb symptoms. No unusual exp hx or h/o childhood pna/ asthma or knowledge of premature birth.  Sleeping ok without nocturnal  or early am exacerbation  of respiratory  c/o's or need for noct saba. Also denies any obvious fluctuation of symptoms with weather or environmental changes or other aggravating or alleviating factors except as outlined above   Current Medications, Allergies, Complete Past Medical History, Past Surgical History, Family History, and Social History were reviewed in [facility] electronic medical record.              Review of Systems  Constitutional: Negative for fever and unexpected weight change.  HENT: Positive for congestion and sore throat.  Negative for dental problem, ear pain, nosebleeds, postnasal drip, rhinorrhea, sinus pressure, sneezing and trouble swallowing.   Eyes: Negative for redness and itching.  Respiratory: Positive for chest tightness. Negative for cough, shortness of breath and wheezing.   Cardiovascular: Positive for chest pain. Negative for palpitations and leg swelling.  Gastrointestinal: Negative for nausea and vomiting.  Genitourinary: Negative for dysuria.  Musculoskeletal: Negative for joint swelling.  Skin: Negative for rash.  Neurological: Negative for headaches.  Hematological: Does not bruise/bleed easily.  Psychiatric/Behavioral: Negative for dysphoric mood. The patient is not nervous/anxious.        Objective:   Physical Exam  Wt Readings from Last 3 Encounters:  05/27/13 138 lb 3.2 oz (62.687 kg)  04/12/13 136 lb (61.689 kg)  04/09/13 137 lb 12 oz (62.483 kg)      HEENT: nl dentition, turbinates, and orophanx. Nl external ear canals without cough reflex   NECK :  without JVD/Nodes/TM/ nl carotid upstrokes bilaterally   LUNGS: no acc muscle use, clear to A and P bilaterally without cough on insp or exp maneuvers   CV:  RRR  no s3 or murmur or increase in P2, no edema   ABD:  soft and nontender with nl excursion in the supine position. No bruits or organomegaly, bowel sounds nl  MS:  warm without deformities, calf tenderness, cyanosis or clubbing  SKIN: warm and dry without lesions    NEURO:  alert, approp, no deficits     CXR  04/12/13  Opacity in the right upper lung field most likely represents focal  pneumonia. Recommend followup chest x-ray to ensure clearing      Assessment & Plan:

## 2013-05-29 NOTE — Assessment & Plan Note (Signed)
Longstanding with acute flare ? pna onset in March 2015 but almost completely better now and unlikely to be able to "normalize" her cxr at this point.  Always concern in this setting for MAI and generating resistent pyogenic organism from over treatment with FQ's so will rec leave off all abx for now and regroup in 4-6 weeks with cxr  In meantime rx with prevnar and w/u for Alpha one AT to be complete.  See instructions for specific recommendations which were reviewed directly with the patient who was given a copy with highlighter outlining the key components.

## 2013-05-30 LAB — ALPHA-1-ANTITRYPSIN: A-1 Antitrypsin, Ser: 147 mg/dL (ref 83–199)

## 2013-05-30 LAB — QUANTIFERON TB GOLD ASSAY (BLOOD)
INTERFERON GAMMA RELEASE ASSAY: NEGATIVE
MITOGEN VALUE: 8.03 [IU]/mL
QUANTIFERON TB AG MINUS NIL: 0 [IU]/mL
Quantiferon Nil Value: 0.02 IU/mL
TB Ag value: 0.02 IU/mL

## 2013-06-03 LAB — ALPHA-1 ANTITRYPSIN PHENOTYPE: A-1 Antitrypsin: 152 mg/dL (ref 83–199)

## 2013-06-06 NOTE — Progress Notes (Signed)
Quick Note:  Spoke with pt and notified of results per Dr. Wert. Pt verbalized understanding and denied any questions.  ______ 

## 2013-07-11 ENCOUNTER — Ambulatory Visit (INDEPENDENT_AMBULATORY_CARE_PROVIDER_SITE_OTHER): Payer: BC Managed Care – PPO | Admitting: Internal Medicine

## 2013-07-11 ENCOUNTER — Ambulatory Visit (INDEPENDENT_AMBULATORY_CARE_PROVIDER_SITE_OTHER)
Admission: RE | Admit: 2013-07-11 | Discharge: 2013-07-11 | Disposition: A | Discharge status: Home / Self Care | Payer: BC Managed Care – PPO | Source: Ambulatory Visit | Attending: Internal Medicine | Admitting: Internal Medicine

## 2013-07-11 ENCOUNTER — Encounter: Payer: Self-pay | Admitting: Internal Medicine

## 2013-07-11 VITALS — BP 122/80 | HR 70 | Temp 98.0°F | Ht 67.0 in | Wt 138.2 lb

## 2013-07-11 DIAGNOSIS — J479 Bronchiectasis, uncomplicated: Secondary | ICD-10-CM

## 2013-07-11 NOTE — Patient Instructions (Signed)
Bronchiectasis =   you have scarring of your bronchial tubes which means that they don't function perfectly normally and mucus tends to pool in certain areas of your lung which can cause pneumonia and further scarring of your lung and bronchial tubes  Whenever you develop cough congestion take mucinex or mucinex dm > these will help keep the mucus loose and flowing but if your condition worsens you need to seek help immediately preferably here or somewhere inside the [facility] system to compare xrays ( worse = darker or bloody mucus or pain on breathing in)   If not staying 100% better next steps are CT chest and Sinus - you can call Libby at [phone] to schedule

## 2013-07-11 NOTE — Assessment & Plan Note (Addendum)
-   see [facility] report 06/10/2005  - Alpha One AT 05/27/13 > MM - Prevnar given 05/27/13   I had an extended summary discussion with the patient today lasting 15 to 20 minutes of a 25 minute visit on the following issues:  Marked clinical/radiographic improvement after rx for pna but given previous finding of bronchiectasis likely to have recurrent infections and if so next episode would do sinus/chest CT as no CT's on  file  in over 5 years but not needed at this particular point as marked improvement in RUL and clinical resolution strongly arguing against alternative explanation for symptoms or MAI/ resistant orgs though is at risk.  Discussed in detail all the  indications, usual  risks and alternatives  relative to the benefits with patient who agrees to proceed with conservative f/u on prn basis

## 2013-07-11 NOTE — Progress Notes (Signed)
Subjective:    Patient ID: Alexandra Rodriguez, female    DOB: [birth year]   MRN: [id number]    Brief patient profile:  62 yowf quit smoking 1984 with abn scans by Dr Jethro Bolus in 2005 > referred to [facility] with Dx of bronchiectasis made on [facility] CT report 06/10/2005 on file in EPIC involvlng RML and fine s need for any meds except for occ episodes of " bronchitis" > primary care eval and rx 100% response each time but in early march 2015 onset of sinus congestion/then R lower flank pain radiated R ant chest over sev days with pleuritic features dx as pna rx rocephin and levaquin x 18 days improved symptomatically  but rreferred 05/27/2013 to pulmonary clinic for eval of persistent infiltrates on cxr    History of Present Illness  05/27/2013 1st [place] [facility] office visit/ EMR eraWert  Chief Complaint  Patient presents with  . Pulmonary Consult    PNA since 04/2013 with no clearing.  Still having tightness/heayness in chest  min cough no purulent sputum and chest discomfort was a 10/10 and pain gone now only minimally tight tight with a deep insp maneuver "whenever breath deep" - feels best when takes mucinex / not on any inhalers. rec mucinex = 1200 mg every hours as needed Prevar 13 the last pneumonia shot you'll need     07/11/2013 f/u ov/Wesson Stith re: bronchiectasis Chief Complaint  Patient presents with  . Follow-up    Pt states chest heaviness has resolved. Denies SOB, cough and CP/tightness. Pt states overall she is doing very well.   Not limited by breathing from desired activities  /no need for any inhalers except flonase     No obvious day to day or daytime variabilty or assoc chronic cough or cp or chest tightness, subjective wheeze overt sinus or hb symptoms. No unusual exp hx or h/o childhood pna/ asthma or knowledge of premature birth.  Sleeping ok without nocturnal  or early am exacerbation  of respiratory  c/o's or need for noct saba. Also denies any obvious fluctuation of  symptoms with weather or environmental changes or other aggravating or alleviating factors except as outlined above   Current Medications, Allergies, Complete Past Medical History, Past Surgical History, Family History, and Social History were reviewed in [employer] record.  ROS  The following are not active complaints unless bolded sore throat, dysphagia, dental problems, itching, sneezing,  nasal congestion or excess/ purulent secretions, ear ache,   fever, chills, sweats, unintended wt loss, pleuritic or exertional cp, hemoptysis,  orthopnea pnd or leg swelling, presyncope, palpitations, heartburn, abdominal pain, anorexia, nausea, vomiting, diarrhea  or change in bowel or urinary habits, change in stools or urine, dysuria,hematuria,  rash, arthralgias, visual complaints, headache, numbness weakness or ataxia or problems with walking or coordination,  change in mood/affect or memory.                      Objective:   Physical Exam  07/11/2013         138  Wt Readings from Last 3 Encounters:  05/27/13 138 lb 3.2 oz (62.687 kg)  04/12/13 136 lb (61.689 kg)  04/09/13 137 lb 12 oz (62.483 kg)      HEENT: nl dentition, turbinates, and orophanx. Nl external ear canals without cough reflex   NECK :  without JVD/Nodes/TM/ nl carotid upstrokes bilaterally   LUNGS: no acc muscle use, clear to A and P bilaterally without cough on  insp or exp maneuvers   CV:  RRR  no s3 or murmur or increase in P2, no edema   ABD:  soft and nontender with nl excursion in the supine position. No bruits or organomegaly, bowel sounds nl  MS:  warm without deformities, calf tenderness, cyanosis or clubbing         CXR  07/11/2013 : Parenchymal density in the right upper lobe is much less conspicuous today but subtle increased density persists. Elsewhere the lungs are clear. The heart and mediastinal structures are normal. There is no pleural effusion. There is curvature of the  thoracolumbar spine with the convexity towards the right.       Assessment & Plan:

## 2013-07-12 ENCOUNTER — Ambulatory Visit: Payer: BC Managed Care – PPO | Admitting: Internal Medicine

## 2013-11-23 ENCOUNTER — Telehealth: Payer: Self-pay | Admitting: Internal Medicine

## 2013-11-23 NOTE — Telephone Encounter (Signed)
Pt states that she had PNA in Spring 2015 Pt states that this was confirmed to have not fully cleared up in recent cxr.  Pt c/o increased sinus issues and a fullness in her chest. Some chest discomfort.  Denies cough. States that she is having some discolored mucus in nose. Using Mucinex OTC.  Pt concerned that her PNA is worsening.  No Known Allergies  Please advise Dr Sherene SiresWert. Thanks.

## 2013-11-23 NOTE — Telephone Encounter (Signed)
Correction---Appt made for 10/22 at 11:30 with MW.  Nothing further needed.

## 2013-11-23 NOTE — Telephone Encounter (Signed)
Needs ov tomorrow ok to add on - if any sudden worsening to er in meantime

## 2013-11-23 NOTE — Telephone Encounter (Signed)
Called and spoke to pt. Appt made with MW at 1100. Nothing further needed.

## 2013-11-24 ENCOUNTER — Encounter: Payer: Self-pay | Admitting: Internal Medicine

## 2013-11-24 ENCOUNTER — Telehealth: Payer: Self-pay | Admitting: Internal Medicine

## 2013-11-24 ENCOUNTER — Ambulatory Visit (INDEPENDENT_AMBULATORY_CARE_PROVIDER_SITE_OTHER): Payer: BC Managed Care – PPO | Admitting: Internal Medicine

## 2013-11-24 VITALS — BP 116/74 | HR 80 | Temp 98.7°F | Ht 67.5 in | Wt 138.0 lb

## 2013-11-24 DIAGNOSIS — Z23 Encounter for immunization: Secondary | ICD-10-CM

## 2013-11-24 DIAGNOSIS — J471 Bronchiectasis with (acute) exacerbation: Secondary | ICD-10-CM

## 2013-11-24 MED ORDER — AMOXICILLIN-POT CLAVULANATE 875-125 MG PO TABS: 1.0000 | ORAL_TABLET | Freq: Two times a day (BID) | ORAL | Status: DC

## 2013-11-24 NOTE — Patient Instructions (Signed)
Augmentin 875 mg take one pill twice daily  X 10 days - take at breakfast and supper with large glass of water.  It would help reduce the usual side effects (diarrhea and yeast infections) if you ate cultured yogurt at lunch.   Please see patient coordinator before you leave today  to schedule chest ct and sinus in 10 days

## 2013-11-24 NOTE — Progress Notes (Signed)
Subjective:    Patient ID: Alexandra Rodriguez, female    DOB: 03-25-1950   MRN: [id number]    Brief patient profile:  [age]  yowf quit smoking 1984 with abn scans by Dr Jethro BolusGene [place] in 2005 > [facility] Dx of bronchiectasis made on [facility] CT report 06/10/2005 on file in EPIC involvlng RML and fine s need for any meds except for occ episodes of " bronchitis" > primary care eval and rx 100% response each time but in early march 2015 onset of sinus congestion/then R lower flank pain radiated R ant chest over sev days with pleuritic features dx as pna rx rocephin and levaquin x 18 days improved symptomatically  but rreferred 05/27/2013 to pulmonary clinic for eval of persistent infiltrates on cxr    History of Present Illness  05/27/2013 1st [place] [facility] office visit/ EMR eraWert  Chief Complaint  Patient presents with  . Pulmonary Consult    PNA since 04/2013 with no clearing.  Still having tightness/heayness in chest  min cough no purulent sputum and chest discomfort was a 10/10 and pain gone now only minimally tight tight with a deep insp maneuver "whenever breath deep" - feels best when takes mucinex / not on any inhalers. rec mucinex = 1200 mg every hours as needed Prevar 13 the last pneumonia shot you'll need     07/11/2013 f/u ov/Alexandra Rodriguez re: bronchiectasis Chief Complaint  Patient presents with  . Follow-up    Pt states chest heaviness has resolved. Denies SOB, cough and CP/tightness. Pt states overall she is doing very well.   Not limited by breathing from desired activities  /no need for any inhalers except flonase  rec Bronchiectasis =   you have scarring of your bronchial tubes If not staying 100% better next steps are CT chest and Sinus - you can call Libby at [phone] to schedule> did not do   11/23/2013 acute  ov/Alexandra Rodriguez re: ? Bronchiectasis flare  Chief Complaint  Patient presents with  . Acute Visit    Pt c/o "sinus issues" x 2 wks- yesterday woke up with the right  side of her chest "feeling full"- also has fatigue.    Indolent onset but progressive > chest discomfort R Ant worse with coughing assoc with  Mucus   turned dark yellow, has had nasal drainage same color, only using otc.     No obvious day to day or daytime variabilty or assoc sob  subjective wheeze overt sinus or hb symptoms. No unusual exp hx or h/o childhood pna/ asthma or knowledge of premature birth.  Sleeping ok without nocturnal  or early am exacerbation  of respiratory  c/o's or need for noct saba. Also denies any obvious fluctuation of symptoms with weather or environmental changes or other aggravating or alleviating factors except as outlined above   Current Medications, Allergies, Complete Past Medical History, Past Surgical History, Family History, and Social History were reviewed in [facility] electronic medical record.  ROS  The following are not active complaints unless bolded sore throat, dysphagia, dental problems, itching, sneezing,  nasal congestion or excess/ purulent secretions, ear ache,   fever, chills, sweats, unintended wt loss,   exertional cp, hemoptysis,  orthopnea pnd or leg swelling, presyncope, palpitations, heartburn, abdominal pain, anorexia, nausea, vomiting, diarrhea  or change in bowel or urinary habits, change in stools or urine, dysuria,hematuria,  rash, arthralgias, visual complaints, headache, numbness weakness or ataxia or problems with walking or coordination,  change in mood/affect  or memory.                      Objective:   Physical Exam  07/11/2013         138 > 11/24/2013  138  Wt Readings from Last 3 Encounters:  05/27/13 138 lb 3.2 oz (62.687 kg)  04/12/13 136 lb (61.689 kg)  04/09/13 137 lb 12 oz (62.483 kg)     amb wf nad  HEENT: nl dentition, turbinates, and orophanx. Nl external ear canals without cough reflex   NECK :  without JVD/Nodes/TM/ nl carotid upstrokes bilaterally   LUNGS: no acc muscle use, clear to A and P  bilaterally without cough on insp or exp maneuvers   CV:  RRR  no s3 or murmur or increase in P2, no edema   ABD:  soft and nontender with nl excursion in the supine position. No bruits or organomegaly, bowel sounds nl  MS:  warm without deformities, calf tenderness, cyanosis or clubbing         CXR  07/11/2013 : Parenchymal density in the right upper lobe is much less conspicuous today but subtle increased density persists. Elsewhere the lungs are clear. The heart and mediastinal structures are normal. There is no pleural effusion. There is curvature of the thoracolumbar spine with the convexity towards the right.       Assessment & Plan:

## 2013-11-24 NOTE — Telephone Encounter (Signed)
Per 11/24/13 OV w/ MW; Patient Instructions      Augmentin 875 mg take one pill twice daily  X 10 days - take at breakfast and supper with large glass of water.  It would help reduce the usual side effects (diarrhea and yeast infections) if you ate cultured yogurt at lunch.    RX has been sent in for pt. Nothing further needed

## 2013-11-26 NOTE — Assessment & Plan Note (Addendum)
-   see [facility] report 06/10/2005  - Alpha One AT 05/27/13 > MM  Most likely present symptoms are due to flare of bronchiectasis in same exact distribution anatomically as prev described so approp for empirical augmentin x 10 days then complete the w/u with sinus and chest ct   See instructions for specific recommendations which were reviewed directly with the patient who was given a copy with highlighter outlining the key components.

## 2013-12-06 ENCOUNTER — Telehealth: Payer: Self-pay | Admitting: Internal Medicine

## 2013-12-06 ENCOUNTER — Ambulatory Visit (INDEPENDENT_AMBULATORY_CARE_PROVIDER_SITE_OTHER)
Admission: RE | Admit: 2013-12-06 | Discharge: 2013-12-06 | Disposition: A | Discharge status: Home / Self Care | Payer: BC Managed Care – PPO | Source: Ambulatory Visit | Attending: Internal Medicine | Admitting: Internal Medicine

## 2013-12-06 DIAGNOSIS — J471 Bronchiectasis with (acute) exacerbation: Secondary | ICD-10-CM

## 2013-12-06 MED ORDER — LEVOFLOXACIN 500 MG PO TABS: 500.0000 mg | ORAL_TABLET | Freq: Every day | ORAL | Status: DC

## 2013-12-06 NOTE — Progress Notes (Signed)
Quick Note:  LMTCB ______ 

## 2013-12-06 NOTE — Progress Notes (Signed)
Quick Note:  Spoke with pt and notified of results per Dr. Wert. Pt verbalized understanding and denied any questions.  ______ 

## 2013-12-06 NOTE — Telephone Encounter (Signed)
I spoke with the pt regarding her sinus and chest ct  She verbalized understanding  Rx was sent to pharm for levaquin since pt was still c/o chest congestion  F/u set for 12/21/13

## 2013-12-07 NOTE — Progress Notes (Signed)
Quick Note:  Spoke with pt and notified of results per Dr. Wert. Pt verbalized understanding and denied any questions.  ______ 

## 2013-12-14 ENCOUNTER — Telehealth: Payer: Self-pay | Admitting: Internal Medicine

## 2013-12-14 NOTE — Telephone Encounter (Signed)
Called and spoke with pt and she is aware of MW recs.  Pt has pending appt on next Wednesday and she will call if anything further is needed.

## 2013-12-14 NOTE — Telephone Encounter (Signed)
Called and spoke with pt and she stated that she is still having the drainage and the fullness in her chest.  She is wanting to know if MW has any other recs for her other than refilling the flonase.  MW please advise. Thanks  No Known Allergies  Current Outpatient Prescriptions on File Prior to Visit  Medication Sig Dispense Refill  . amoxicillin-clavulanate (AUGMENTIN) 875-125 MG per tablet Take 1 tablet by mouth 2 (two) times daily. 20 tablet 0  . Calcium-Magnesium-Vitamin D 500-50-100 MG-MG-UNIT CHEW Chew 1 tablet by mouth daily.    . Cholecalciferol (VITAMIN D) 2000 UNITS tablet Take 2,000 Units by mouth daily.    Marland Kitchen. levofloxacin (LEVAQUIN) 500 MG tablet Take 1 tablet (500 mg total) by mouth daily. 7 tablet 0   No current facility-administered medications on file prior to visit.

## 2013-12-14 NOTE — Telephone Encounter (Signed)
Stop flonase For drainage take chlortrimeton (chlorpheniramine) 4 mg every 4 hours available over the counter (may cause drowsiness)   Ov if not better or let me refer her to ENT

## 2013-12-21 ENCOUNTER — Ambulatory Visit (INDEPENDENT_AMBULATORY_CARE_PROVIDER_SITE_OTHER): Payer: BC Managed Care – PPO | Admitting: Internal Medicine

## 2013-12-21 ENCOUNTER — Encounter: Payer: Self-pay | Admitting: Internal Medicine

## 2013-12-21 VITALS — BP 126/82 | HR 70 | Ht 67.0 in | Wt 143.0 lb

## 2013-12-21 DIAGNOSIS — J479 Bronchiectasis, uncomplicated: Secondary | ICD-10-CM

## 2013-12-21 NOTE — Progress Notes (Signed)
Subjective:    Patient ID: Alexandra Rodriguez, female    DOB: Jun 20, 1950   MRN: [id number]    Brief patient profile:  [id number]  yowf quit smoking 1984 with abn scans by Dr Jethro BolusGene [place] in 2005 > [facility] Dx of bronchiectasis made on [facility] CT report 06/10/2005 on file in EP[facility] involvlng RML and fine s need for any meds except for occ episodes of " bronchitis" > primary care eval and rx 100% response each time but in early march 2015 onset of sinus congestion/then R lower flank pain radiated R ant chest over sev days with pleuritic features dx as pna rx rocephin and levaquin x 18 days improved symptomatically  but referred 05/27/2013 to pulmonary clinic for eval of persistent infiltrates on cxr    History of Present Illness  05/27/2013 1st Alexandra Rodriguez Pulmonary office visit/ EMR [facility]  Chief Complaint  Patient presents with  . Pulmonary Consult    PNA since 04/2013 with no clearing.  Still having tightness/heayness in chest  min cough no purulent sputum and chest discomfort was a 10/10 and pain gone now only minimally tight tight with a deep insp maneuver "whenever breath deep" - feels best when takes mucinex / not on any inhalers. rec mucinex = 1200 mg every hours as needed Prevar 13 the last pneumonia shot you'll need     07/11/2013 f/u ov/Ercia Crisafulli re: bronchiectasis Chief Complaint  Patient presents with  . Follow-up    Pt states chest heaviness has resolved. Denies SOB, cough and CP/tightness. Pt states overall she is doing very well.   Not limited by breathing from desired activities  /no need for any inhalers except flonase  rec Bronchiectasis =   you have scarring of your bronchial tubes If not staying 100% better next steps are CT chest and Sinus - you can call Libby at [phone] to schedule> did not do   11/23/2013 acute  ov/Haylee Mcanany re: ? Bronchiectasis flare  Chief Complaint  Patient presents with  . Acute Visit    Pt c/o "sinus issues" x 2 wks- yesterday woke up with the right  side of her chest "feeling full"- also has fatigue.   Indolent onset but progressive > chest discomfort R Ant worse with coughing assoc with  Mucus   turned dark yellow, has had nasal drainage same color, only using otc rec Augmentin 875 mg take one pill twice daily  X 10 days - take at breakfast and supper with large glass of water.  It would help reduce the usual side effects (diarrhea and yeast infections) if you ate cultured yogurt at lunch.  Please see patient coordinator before you leave today  to schedule chest ct and sinus in 10 days   12/21/2013 f/u ov/Klynn Linnemann re: bronchiectasis  Chief Complaint  Patient presents with  . Follow-up    Pt states overall feeling better. She still has "some fullness" in chest.    fullness  is not pleuritic and not made worse than lying down / located ant well above R breast and just medial to mid clavicular line   No obvious day to day or daytime variabilty or assoc sob  subjective wheeze overt sinus or hb symptoms. No unusual exp hx or h/o childhood pna/ asthma or knowledge of premature birth.  Sleeping ok without nocturnal  or early am exacerbation  of respiratory  c/o's or need for noct saba. Also denies any obvious fluctuation of symptoms with weather or environmental changes or other aggravating  or alleviating factors except as outlined above   Current Medications, Allergies, Complete Past Medical History, Past Surgical History, Family History, and Social History were reviewed in [facility] electronic medical record.  ROS  The following are not active complaints unless bolded sore throat, dysphagia, dental problems, itching, sneezing,  nasal congestion or excess/ purulent secretions, ear ache,   fever, chills, sweats, unintended wt loss,   exertional cp, hemoptysis,  orthopnea pnd or leg swelling, presyncope, palpitations, heartburn, abdominal pain, anorexia, nausea, vomiting, diarrhea  or change in bowel or urinary habits, change in stools or urine,  dysuria,hematuria,  rash, arthralgias, visual complaints, headache, numbness weakness or ataxia or problems with walking or coordination,  change in mood/affect or memory.                   Objective:   Physical Exam  07/11/2013         138 > 11/24/2013  138 > 12/21/2013  143 Wt Readings from Last 3 Encounters:  05/27/13 138 lb 3.2 oz (62.687 kg)  04/12/13 136 lb (61.689 kg)  04/09/13 137 lb 12 oz (62.483 kg)     amb wf nad  HEENT: nl dentition, turbinates, and orophanx. Nl external ear canals without cough reflex   NECK :  without JVD/Nodes/TM/ nl carotid upstrokes bilaterally   LUNGS: no acc muscle use, clear to A and P bilaterally without cough on insp or exp maneuvers   CV:  RRR  no s3 or murmur or increase in P2, no edema   ABD:  soft and nontender with nl excursion in the supine position. No bruits or organomegaly, bowel sounds nl  MS:  warm without deformities, calf tenderness, cyanosis or clubbing        12/06/13 small area of airspace consolidation lateral segment of the right middle lobe with some surrounding ground-glass attenuation. This could be infectious, or could indicate a small focus of hemorrhage     Assessment & Plan:

## 2013-12-21 NOTE — Patient Instructions (Addendum)
For cough/ congestion > mucinex or mucinex dm 1200 every 12 hours as needed   I would be happy to see you here if needed for flare of cough/ nasty mucus/ pain with deep breathing

## 2013-12-21 NOTE — Assessment & Plan Note (Signed)
-   see [facility] report 06/10/2005  - [facility] AT 05/27/13 > MM -CT chest rec 11/26/2013 > 1. No findings to suggest interstitial lung disease at this time. 2. However, there is a spectrum of findings in the lungs suggestive of a chronic indolent atypical infectious process such is mild mycobacterium avium intracellulare (MAI). At this time, there is a small area of airspace consolidation lateral segment of the right middle lobe with some surrounding ground-glass attenuation. This could be infectious, or could indicate a small focus of hemorrhage. -Sinus CT 12/06/2013 > Clear sinuses.  She is much better with min residual full sensation RUL not worse with insp or cough (never really had a pleuritic component in this distribution) nor worse lying down and I suspect its mscp and not related to wispy changes in the RML nor is there indication for additonal pulmonary w/u at this point   Reviewed natural hx of bronchiectasis and when / if need to return - See instructions for specific recommendations which were reviewed directly with the patient who was given a copy with highlighter outlining the key components.

## 2013-12-23 ENCOUNTER — Telehealth: Payer: Self-pay | Admitting: Internal Medicine

## 2013-12-23 NOTE — Telephone Encounter (Signed)
She needs to contact medical records if needing her whole chart faxed over  [facility]

## 2013-12-23 NOTE — Telephone Encounter (Signed)
Pt returned call and informed her that if she wanted whole chart that she would have to go thru our med, rec. Dept, and i tranferred her there.Caren GriffinsStanley A Dalton

## 2013-12-23 NOTE — Telephone Encounter (Signed)
Leslie, please advise! 

## 2014-01-28 ENCOUNTER — Emergency Department (INDEPENDENT_AMBULATORY_CARE_PROVIDER_SITE_OTHER): Payer: BC Managed Care – PPO

## 2014-01-28 ENCOUNTER — Emergency Department
Admission: EM | Admit: 2014-01-28 | Discharge: 2014-01-28 | Disposition: A | Discharge status: Home / Self Care | Payer: BC Managed Care – PPO | Source: Home / Self Care | Attending: Emergency Medicine | Admitting: Emergency Medicine

## 2014-01-28 DIAGNOSIS — R059 Cough, unspecified: Secondary | ICD-10-CM

## 2014-01-28 DIAGNOSIS — R05 Cough: Secondary | ICD-10-CM

## 2014-01-28 DIAGNOSIS — R0602 Shortness of breath: Secondary | ICD-10-CM

## 2014-01-28 DIAGNOSIS — J189 Pneumonia, unspecified organism: Secondary | ICD-10-CM

## 2014-01-28 MED ORDER — CEFTRIAXONE SODIUM 1 G IJ SOLR
1.0000 g | INTRAMUSCULAR | Status: AC
  Administered 2014-01-28: 1 g via INTRAMUSCULAR

## 2014-01-28 MED ORDER — IBUPROFEN 600 MG PO TABS: 600.0000 mg | ORAL_TABLET | Freq: Four times a day (QID) | ORAL | Status: DC | PRN

## 2014-01-28 MED ORDER — GUAIFENESIN ER 600 MG PO TB12: 600.0000 mg | ORAL_TABLET | Freq: Two times a day (BID) | ORAL | Status: DC

## 2014-01-28 MED ORDER — CLARITHROMYCIN 500 MG PO TABS: ORAL_TABLET | ORAL | Status: DC

## 2014-01-28 MED ORDER — PROMETHAZINE-CODEINE 6.25-10 MG/5ML PO SYRP: ORAL_SOLUTION | ORAL | Status: DC

## 2014-01-28 NOTE — ED Notes (Signed)
Alexandra Rodriguez complains of cough, left side lung pain, body aches and shortness of breath. She does have a pulmonologist due to recent lung infection. She has had Prevnar 13 vaccine.

## 2014-01-28 NOTE — ED Provider Notes (Addendum)
CSN: [id number]     Arrival date & time 01/28/14  [id number] History   First MD Initiated Contact with Patient 01/28/14 [id number]     Chief Complaint  Patient presents with  . Cough  . Generalized Body Aches  . Shortness of Breath   (Consider location/radiation/quality/duration/timing/severity/associated sxs/prior Treatment) HPI  Alexandra Alexandra is a 63 y.o. female who complains progressively worsening cough, "left side lung pain", body aches  Have been using over-the-counter treatment , not helping.  + chills/sweats +  Fever  + mild  Nasal congestion + occas  Discolored Post-nasal drainage No sinus pain/pressure No sore throat  +  Cough, occas yellow sputum No wheezing + chest congestion No hemoptysis Mild shortness of breath + pleuritic pain No exertional chest pain.  No itchy/red eyes No earache  No nausea No vomiting No abdominal pain No diarrhea  No skin rashes +  Fatigue No myalgias No headache   Past Medical History  Diagnosis Date  . Bronchitis   . Bronchitis    Past Surgical History  Procedure Laterality Date  . Tubal ligation    . Tubal ligation     Family History  Problem Relation Age of Onset  . Thyroid disease Mother   . Heart failure Father    History  Substance Use Topics  . Smoking status: Former Smoker -- 0.75 packs/day for 10 years    Types: Cigarettes    Quit date: 02/03/1982  . Smokeless tobacco: Not on file  . Alcohol Use: Yes     Comment: 2-3 per wk   OB History    No data available     Review of Systems  All other systems reviewed and are negative.   Allergies  Review of patient's allergies indicates no known allergies.  Home Medications   Prior to Admission medications   Medication Sig Start Date End Date Taking? Authorizing Provider  Calcium-Magnesium-Vitamin D 500-50-100 MG-MG-UNIT CHEW Chew 1 tablet by mouth daily.   Yes Historical Provider, MD  chlorpheniramine (CHLOR-TRIMETON) 4 MG tablet Take 4 mg by mouth every 4 (four)  hours as needed for allergies.   Yes Historical Provider, MD  Cholecalciferol (VITAMIN D) 2000 UNITS tablet Take 2,000 Units by mouth daily.   Yes Historical Provider, MD  montelukast (SINGULAIR) 10 MG tablet Take 10 mg by mouth at bedtime.   Yes Historical Provider, MD  clarithromycin (BIAXIN) 500 MG tablet Take 1 twice a day for 10 days. 01/28/14   Lajean Manesavid Massey, MD  guaiFENesin (MUCINEX) 600 MG 12 hr tablet Take 1 tablet (600 mg total) by mouth 2 (two) times daily. (This may be over-the-counter) 01/28/14   Lajean Manesavid Massey, MD  ibuprofen (ADVIL,MOTRIN) 600 MG tablet Take 1 tablet (600 mg total) by mouth every 6 (six) hours as needed for fever or moderate pain. 01/28/14   Lajean Manesavid Massey, MD  promethazine-codeine [facility](PHENERGAN WITH CODEINE) 6.25-10 MG/5ML syrup Take 1-2 teaspoons at bedtime (or every 6 hours as needed) for cough. May cause drowsiness. 01/28/14   Lajean Manesavid Massey, MD   BP 113/77 mmHg  Pulse 97  Temp(Src) 98 F (36.7 C) (Oral)  Resp 20  Ht 5\' 7"  (1.702 m)  Wt 135 lb (61.236 kg)  BMI 21.14 kg/m2  SpO2 98% Physical Exam  Constitutional: She is oriented to person, place, and time. She appears well-developed and well-nourished.  Non-toxic appearance. She appears ill. No distress.  HENT:  Head: Normocephalic and atraumatic.  Right Ear: Tympanic membrane normal.  Left Ear: Tympanic membrane normal.  Nose: Nose  normal.  Mouth/Throat: Oropharynx is clear and moist. No oropharyngeal exudate.  Eyes: Right eye exhibits no discharge. Left eye exhibits no discharge. No scleral icterus.  Neck: Neck supple. No JVD present. No tracheal deviation present.  Cardiovascular: Normal rate, regular rhythm and normal heart sounds.   Pulmonary/Chest: No stridor. No respiratory distress. She has no wheezes. She has rhonchi. She has rales (Left).  Abdominal: Soft. She exhibits no distension.  Musculoskeletal: She exhibits no edema.  Lymphadenopathy:    She has no cervical adenopathy.  Neurological: She is  alert and oriented to person, place, and time. No cranial nerve deficit.  Skin: Skin is warm and dry. No rash noted.  Psychiatric: She has a normal mood and affect.  Nursing note and vitals reviewed.  Oxygen saturation 98% on room air ED Course  Procedures (including critical care time) Labs Review Labs Reviewed - No data to display  Imaging Review Dg Chest 2 View  01/28/2014   CLINICAL DATA:  Cough and short of breath for 2 day  EXAM: CHEST  2 VIEW  COMPARISON:  07/11/2013  FINDINGS: Consolidation in the lingula. Hyperaeration. Normal heart size. No pneumothorax.  IMPRESSION: Consolidation in the lingula. Follow-up studies until resolution are recommended.   Electronically Signed   By: Maryclare BeanArt  Hoss M.D.   On: 01/28/2014 11:04    MDM   1. Lingular pneumonia   2. Cough   3. Shortness of breath    Treatment options discussed, as well as risks, benefits, alternatives. Patient voiced understanding and agreement with the following plans: Discharge Medication List as of 01/28/2014 11:36 AM    START taking these medications   Details  clarithromycin (BIAXIN) 500 MG tablet Take 1 twice a day for 10 days., Print    guaiFENesin (MUCINEX) 600 MG 12 hr tablet Take 1 tablet (600 mg total) by mouth 2 (two) times daily. (This may be over-the-counter), Starting 01/28/2014, Until Discontinued, Print    ibuprofen (ADVIL,MOTRIN) 600 MG tablet Take 1 tablet (600 mg total) by mouth every 6 (six) hours as needed for fever or moderate pain., Starting 01/28/2014, Until Discontinued, Print    promethazine-codeine (PHENERGAN WITH CODEINE) 6.25-10 MG/5ML syrup Take 1-2 teaspoons at bedtime (or every 6 hours as needed) for cough. May cause drowsiness., Print       push fluids and other symptomatic care discussed Over 45 minutes spent, greater than 50% of the time spent for counseling and coordination of care. Follow-up with your pulmonologist in 3-4 days, or sooner if symptoms become worse. Precautions  discussed. Red flags discussed.--Emergency room if any red flags Questions invited and answered. Patient voiced understanding and agreement.  See detailed Instructions in AVS, which were given to patient. Verbal instructions also given. Risks, benefits, and alternatives of treatment options discussed. Questions invited and answered. Patient voiced understanding and agreement with plans.  Lajean Manesavid Massey, MD 01/28/14 [id number]  Lajean Manesavid Massey, MD 01/28/14 2236

## 2014-01-28 NOTE — Discharge Instructions (Signed)

## 2014-04-02 ENCOUNTER — Encounter: Payer: Self-pay | Admitting: *Deleted

## 2014-04-02 ENCOUNTER — Emergency Department (INDEPENDENT_AMBULATORY_CARE_PROVIDER_SITE_OTHER)
Admission: EM | Admit: 2014-04-02 | Discharge: 2014-04-02 | Disposition: A | Discharge status: Home / Self Care | Payer: BLUE CROSS/BLUE SHIELD | Source: Home / Self Care | Attending: Family Medicine | Admitting: Family Medicine

## 2014-04-02 DIAGNOSIS — J069 Acute upper respiratory infection, unspecified: Secondary | ICD-10-CM

## 2014-04-02 DIAGNOSIS — B9789 Other viral agents as the cause of diseases classified elsewhere: Principal | ICD-10-CM

## 2014-04-02 MED ORDER — CLARITHROMYCIN 500 MG PO TABS: ORAL_TABLET | ORAL | Status: DC

## 2014-04-02 MED ORDER — BENZONATATE 200 MG PO CAPS: 200.0000 mg | ORAL_CAPSULE | Freq: Every day | ORAL | Status: DC

## 2014-04-02 NOTE — ED Provider Notes (Signed)
CSN: [id number]     Arrival date & time 04/02/14  1254 History   First MD Initiated Contact with Patient 04/02/14 1321     Chief Complaint  Patient presents with  . Cough  . Nasal Congestion      HPI Comments: Patient complains of five day history of typical cold-like symptoms including mild sore throat, sinus congestion, headache, fatigue, and cough. Her cough has become worse over the past two days.  She has a past history of pneumonia last year.  The history is provided by the patient.    Past Medical History  Diagnosis Date  . Bronchitis   . Bronchitis    Past Surgical History  Procedure Laterality Date  . Tubal ligation    . Tubal ligation     Family History  Problem Relation Age of Onset  . Thyroid disease Mother   . Heart failure Father    History  Substance Use Topics  . Smoking status: Former Smoker -- 0.75 packs/day for 10 years    Types: Cigarettes    Quit date: 02/03/1982  . Smokeless tobacco: Not on file  . Alcohol Use: Yes     Comment: 2-3 per wk   OB History    No data available     Review of Systems + sore throat + cough No pleuritic pain No wheezing + nasal congestion + post-nasal drainage + sneezing No sinus pain/pressure No itchy/red eyes ? earache No hemoptysis No SOB No fever/chills No nausea No vomiting No abdominal pain No diarrhea No urinary symptoms No skin rash + fatigue No myalgias + headache Used OTC meds without relief  Allergies  Review of patient's allergies indicates no known allergies.  Home Medications   Prior to Admission medications   Medication Sig Start Date End Date Taking? Authorizing Provider  benzonatate (TESSALON) 200 MG capsule Take 1 capsule (200 mg total) by mouth at bedtime. Take as needed for cough 04/02/14   Lattie Haw, MD  Calcium-Magnesium-Vitamin D 500-50-100 MG-MG-UNIT CHEW Chew 1 tablet by mouth daily.    Historical Provider, MD  chlorpheniramine (CHLOR-TRIMETON) 4 MG tablet Take 4 mg  by mouth every 4 (four) hours as needed for allergies.    Historical Provider, MD  Cholecalciferol (VITAMIN D) 2000 UNITS tablet Take 2,000 Units by mouth daily.    Historical Provider, MD  clarithromycin (BIAXIN) 500 MG tablet Take 1 twice a day for 10 days. 04/02/14   Lattie Haw, MD  guaiFENesin (MUCINEX) 600 MG 12 hr tablet Take 1 tablet (600 mg total) by mouth 2 (two) times daily. (This may be over-the-counter) 01/28/14   Lajean Manes, MD  ibuprofen (ADVIL,MOTRIN) 600 MG tablet Take 1 tablet (600 mg total) by mouth every 6 (six) hours as needed for fever or moderate pain. 01/28/14   Lajean Manes, MD  montelukast (SINGULAIR) 10 MG tablet Take 10 mg by mouth at bedtime.    Historical Provider, MD  promethazine-codeine (PHENERGAN WITH CODEINE) 6.25-10 MG/5ML syrup Take 1-2 teaspoons at bedtime (or every 6 hours as needed) for cough. May cause drowsiness. 01/28/14   Lajean Manes, MD   BP 125/79 mmHg  Pulse 85  Temp(Src) 98 F (36.7 C) (Oral)  Ht  (1.702 m)  Wt 138 lb (62.596 kg)  BMI 21.61 kg/m2  SpO2 99% Physical Exam Nursing notes and Vital Signs reviewed. Appearance:  Patient appears stated age, and in no acute distress Eyes:  Pupils are equal, round, and reactive to light and accomodation.  Extraocular movement is intact.  Conjunctivae are not inflamed  Ears:  Canals normal.  Tympanic membranes normal.  Nose:  Mildly congested turbinates.  No sinus tenderness.   Pharynx:  Normal Neck:  Supple.  Tender enlarged posterior nodes are palpated bilaterally  Lungs:  Clear to auscultation.  Breath sounds are equal.  Heart:  Regular rate and rhythm without murmurs, rubs, or gallops.  Abdomen:  Nontender without masses or hepatosplenomegaly.  Bowel sounds are present.  No CVA or flank tenderness.  Extremities:  No edema.  No calf tenderness Skin:  No rash present.   ED Course  Procedures   none    MDM   1. Viral URI with cough    Begin Biaxin 500mg  BID for atypical coverage.   Prescription written for Benzonatate [facility](Tessalon) to take at bedtime for night-time cough.  Take plain guaifenesin (1200mg  extended release tabs such as Mucinex) twice daily, with plenty of water, for cough and congestion.  May add Pseudoephedrine for sinus congestion.  Get adequate rest.   May use Afrin nasal spray (or generic oxymetazoline) twice daily for about 5 days.  Also recommend using saline nasal spray several times daily and saline nasal irrigation (AYR is a common brand).   Try warm salt water gargles for sore throat.  Stop all antihistamines for now, and other non-prescription cough/cold preparations. Followup with pulmonologist if not improved 10 days.    Lattie HawStephen A Dimas Scheck, MD 04/06/14 1043

## 2014-04-02 NOTE — ED Notes (Signed)
Pt complains of cough, chest and nasal congestion, sinus pressure and headache 6/10 pain for 5 days.

## 2014-04-02 NOTE — Discharge Instructions (Signed)
Take plain guaifenesin (1200mg  extended release tabs such as Mucinex) twice daily, with plenty of water, for cough and congestion.  May add Pseudoephedrine for sinus congestion.  Get adequate rest.   May use Afrin nasal spray (or generic oxymetazoline) twice daily for about 5 days.  Also recommend using saline nasal spray several times daily and saline nasal irrigation (AYR is a common brand).   Try warm salt water gargles for sore throat.  Stop all antihistamines for now, and other non-prescription cough/cold preparations.  .Marland Kitchen

## 2014-04-18 DIAGNOSIS — R059 Cough, unspecified: Secondary | ICD-10-CM | POA: Insufficient documentation

## 2014-07-13 ENCOUNTER — Other Ambulatory Visit: Payer: Self-pay | Admitting: Gastroenterology

## 2014-07-20 ENCOUNTER — Other Ambulatory Visit: Payer: Self-pay | Admitting: Gastroenterology

## 2014-07-20 DIAGNOSIS — R1084 Generalized abdominal pain: Secondary | ICD-10-CM

## 2014-07-26 ENCOUNTER — Ambulatory Visit
Admission: RE | Admit: 2014-07-26 | Discharge: 2014-07-26 | Disposition: A | Discharge status: Home / Self Care | Payer: BLUE CROSS/BLUE SHIELD | Source: Ambulatory Visit | Attending: Gastroenterology | Admitting: Gastroenterology

## 2014-07-26 DIAGNOSIS — R1084 Generalized abdominal pain: Secondary | ICD-10-CM

## 2014-12-03 ENCOUNTER — Emergency Department (INDEPENDENT_AMBULATORY_CARE_PROVIDER_SITE_OTHER)
Admission: EM | Admit: 2014-12-03 | Discharge: 2014-12-03 | Disposition: A | Discharge status: Home / Self Care | Payer: BLUE CROSS/BLUE SHIELD | Source: Home / Self Care | Attending: Family Medicine | Admitting: Family Medicine

## 2014-12-03 ENCOUNTER — Encounter: Payer: Self-pay | Admitting: Emergency Medicine

## 2014-12-03 ENCOUNTER — Emergency Department (INDEPENDENT_AMBULATORY_CARE_PROVIDER_SITE_OTHER): Payer: BLUE CROSS/BLUE SHIELD

## 2014-12-03 DIAGNOSIS — J189 Pneumonia, unspecified organism: Secondary | ICD-10-CM

## 2014-12-03 DIAGNOSIS — M545 Low back pain: Secondary | ICD-10-CM

## 2014-12-03 DIAGNOSIS — R079 Chest pain, unspecified: Secondary | ICD-10-CM

## 2014-12-03 DIAGNOSIS — R0602 Shortness of breath: Secondary | ICD-10-CM | POA: Diagnosis not present

## 2014-12-03 DIAGNOSIS — J181 Lobar pneumonia, unspecified organism: Principal | ICD-10-CM

## 2014-12-03 HISTORY — DX: Pneumonia, unspecified organism: J18.9

## 2014-12-03 LAB — POCT CBC W AUTO DIFF (K'VILLE URGENT CARE)

## 2014-12-03 MED ORDER — CEFTRIAXONE SODIUM 1 G IJ SOLR
1.0000 g | Freq: Once | INTRAMUSCULAR | Status: AC
  Administered 2014-12-03: 1 g via INTRAMUSCULAR

## 2014-12-03 MED ORDER — LEVOFLOXACIN 500 MG PO TABS: 500.0000 mg | ORAL_TABLET | Freq: Every day | ORAL | Status: DC

## 2014-12-03 NOTE — ED Notes (Signed)
Pt c/o right sided pain radiating from her back around to her side under her arm since last night.  Pt has a history of pneumonia, no cough, low grade fever, some SOB.

## 2014-12-03 NOTE — ED Provider Notes (Signed)
CSN: [id number]     Arrival date & time 12/03/14  1422 History   First MD Initiated Contact with Patient 12/03/14 1519     Chief Complaint  Patient presents with  . Pneumonia      HPI Comments: Patient developed right side pleuritic pain that radiated to her right lateral chest last night.  Since then she has had fatigue, chills, and increased thirst but no cough.  She had a URI 10 days ago and she thought that she was improving.  She complains of mild shortness of breath.                                                                                                                                                                                                                                The history is provided by the patient.    Past Medical History  Diagnosis Date  . Bronchitis   . Bronchitis   . Pneumonia    Past Surgical History  Procedure Laterality Date  . Tubal ligation    . Tubal ligation     Family History  Problem Relation Age of Onset  . Thyroid disease Mother   . Heart failure Father    Social History  Substance Use Topics  . Smoking status: Former Smoker -- 0.75 packs/day for 10 years    Types: Cigarettes    Quit date: 02/03/1982  . Smokeless tobacco: None  . Alcohol Use: Yes     Comment: 2-3 per wk   OB History    No data available     Review of Systems No sore throat No cough + pleuritic pain No wheezing + nasal congestion No post-nasal drainage No sinus pain/pressure No itchy/red eyes No earache No hemoptysis + SOB No fever, + chills No nausea No vomiting No abdominal pain No diarrhea No urinary symptoms No skin rash + fatigue No myalgias + headache Used OTC meds without relief  Allergies  Review of patient's allergies indicates no known allergies.  Home Medications   Prior to Admission medications   Medication Sig Start Date End Date Taking? Authorizing Provider  levofloxacin (LEVAQUIN) 500 MG tablet Take 1 tablet (500 mg  total) by mouth daily. 12/03/14   Lattie HawStephen A Zebedee Segundo, MD   Meds Ordered and Administered this Visit   Medications  cefTRIAXone (ROCEPHIN) injection 1 g (1 g Intramuscular Given 12/03/14 1636)    BP 119/79 mmHg  Pulse 112  Temp(Src) 100.3 F (37.9 C) (Oral)  Ht  (1.702 m)  Wt 137 lb (62.143 kg)  BMI 21.45 kg/m2  SpO2 98% No data found.   Physical Exam Nursing notes and Vital Signs reviewed. Appearance:  Patient appears stated age, and in no acute distress Eyes:  Pupils are equal, round, and reactive to light and accomodation.  Extraocular movement is intact.  Conjunctivae are not inflamed  Ears:  Canals normal.  Tympanic membranes normal.  Nose:  Mildly congested turbinates.  No sinus tenderness.    Pharynx:  Normal Neck:  Supple.   Enlarged posterior nodes are palpated bilaterally  Lungs:  Faint expiratory wheezes right chest.  Breath sounds are equal.  Moving air well. Heart:  Regular rate and rhythm without murmurs, rubs, or gallops.  Abdomen:  Nontender without masses or hepatosplenomegaly.  Bowel sounds are present.  No CVA or flank tenderness.  Extremities:  No edema.  No calf tenderness Skin:  No rash present.   ED Course  Procedures  None    Labs Reviewed  POCT CBC W AUTO DIFF ([facility]):  WBC 11.3; LY 16.5; MO 10.3; GR 73.2; Hgb 13.7; Platelets 249     Imaging Review Dg Chest 2 View  12/03/2014  CLINICAL DATA:  Acute onset of right-sided chest and back pain, with chills, weakness and shortness of breath. Initial encounter. EXAM: CHEST  2 VIEW COMPARISON:  Chest radiograph performed 01/28/2014 FINDINGS: There is a 3.4 cm hazy opacity at the right upper lung zone. Given the patient's symptoms, this most likely reflects pneumonia. No definite pleural effusion or pneumothorax is seen. The heart remains normal in size. No acute osseous abnormalities are identified. IMPRESSION: 3.4 cm hazy opacity at the right upper lung zone. Given the patient's symptoms,  this most likely reflects pneumonia. Followup PA and lateral chest X-ray is recommended in 3-4 weeks following trial of antibiotic therapy to ensure resolution and exclude underlying malignancy. Electronically Signed   By: Roanna Raider M.D.   On: 12/03/2014 16:12      MDM   1. Right upper lobe pneumonia    Rocephin 1gm IM. Begin Levaquin  daily for one week. Take plain guaifenesin (  extended release tabs such as Mucinex) twice daily, with plenty of water, for cough and congestion. Get adequate rest.    Stop all antihistamines for now, and other non-prescription cough/cold preparations. May take Robitussin with codeine at bedtime for night cough. If symptoms become significantly worse during the night or over the weekend, proceed to the local emergency room.  Followup with family doctor in about four days. Followup with family doctor or pulmonologist in about 3 weeks for repeat chest x-ray.    Lattie Haw, MD 12/06/14 1341

## 2014-12-03 NOTE — Discharge Instructions (Signed)
Take plain guaifenesin (1200mg  extended release tabs such as Mucinex) twice daily, with plenty of water, for cough and congestion. Get adequate rest.    Stop all antihistamines for now, and other non-prescription cough/cold preparations. May take Robitussin with codeine at bedtime for night cough. If symptoms become significantly worse during the night or over the weekend, proceed to the local emergency room.  Followup with family doctor or pulmonologist in about 3 weeks for repeat chest x-ray.

## 2015-02-12 ENCOUNTER — Encounter: Payer: Self-pay | Admitting: Emergency Medicine

## 2015-02-12 ENCOUNTER — Emergency Department (INDEPENDENT_AMBULATORY_CARE_PROVIDER_SITE_OTHER): Payer: BLUE CROSS/BLUE SHIELD

## 2015-02-12 ENCOUNTER — Emergency Department
Admission: EM | Admit: 2015-02-12 | Discharge: 2015-02-12 | Disposition: A | Discharge status: Home / Self Care | Payer: BLUE CROSS/BLUE SHIELD | Source: Home / Self Care | Attending: Family Medicine | Admitting: Family Medicine

## 2015-02-12 DIAGNOSIS — J189 Pneumonia, unspecified organism: Secondary | ICD-10-CM

## 2015-02-12 DIAGNOSIS — J181 Lobar pneumonia, unspecified organism: Principal | ICD-10-CM

## 2015-02-12 MED ORDER — CLARITHROMYCIN 500 MG PO TABS: 500.0000 mg | ORAL_TABLET | Freq: Two times a day (BID) | ORAL | Status: DC

## 2015-02-12 MED ORDER — CEFTRIAXONE SODIUM 1 G IJ SOLR
1.0000 g | Freq: Once | INTRAMUSCULAR | Status: AC
  Administered 2015-02-12: 1 g via INTRAMUSCULAR

## 2015-02-12 NOTE — Discharge Instructions (Signed)

## 2015-02-12 NOTE — ED Notes (Signed)
Reports pain in left ribs with coughing; wondering if she has pleurisy. Aleve at 1300 today.

## 2015-02-12 NOTE — ED Provider Notes (Signed)
CSN: [id number]     Arrival date & time 02/12/15  1604 History   First MD Initiated Contact with Patient 02/12/15 1613     Chief Complaint  Patient presents with  . Chest Pain   (Consider location/radiation/quality/duration/timing/severity/associated sxs/prior Treatment) HPI Pt is a 65yo female presenting to [facility] with c/o Left sided rib pain with mild intermittent non-productive cough that started about 3-4 days ago.  Pain is sharp with deep inspiration and cough.  She has been taking Aleve, last dose around 1300 today, which provides temporary relief.  She was seen on 12/03/14, dx and treated for Right upper lobe pneumonia. Symptoms did resolve completely, however, around Christmas, her adult daughter came home due to being sick and her husband was sick with pneumonia.  She has only had mild coughing but states she feels generalized fatigue and sleeps for up to 12 hours at night.  Denies fever, chills, n/v/d. Denies hx of asthma. No daily medications. Denies difficulty breathing at this time. Pt reports hx of pleurisy, which this feels similar.   Past Medical History  Diagnosis Date  . Bronchitis   . Bronchitis   . Pneumonia    Past Surgical History  Procedure Laterality Date  . Tubal ligation    . Tubal ligation     Family History  Problem Relation Age of Onset  . Thyroid disease Mother   . Heart failure Father    Social History  Substance Use Topics  . Smoking status: Former Smoker -- 0.75 packs/day for 10 years    Types: Cigarettes    Quit date: 02/03/1982  . Smokeless tobacco: None  . Alcohol Use: Yes     Comment: 2-3 per wk   OB History    No data available     Review of Systems  Constitutional: Positive for fatigue. Negative for fever and chills.  HENT: Positive for congestion and rhinorrhea. Negative for ear pain, sore throat, trouble swallowing and voice change.   Respiratory: Positive for cough and shortness of breath.   Cardiovascular: Positive for chest pain.  Negative for palpitations.  Gastrointestinal: Negative for nausea, vomiting, abdominal pain and diarrhea.  Musculoskeletal: Positive for myalgias and arthralgias. Negative for back pain.  All other systems reviewed and are negative.   Allergies  Review of patient's allergies indicates no known allergies.  Home Medications   Prior to Admission medications   Medication Sig Start Date End Date Taking? Authorizing Provider  clarithromycin (BIAXIN) 500 MG tablet Take 1 tablet (500 mg total) by mouth 2 (two) times daily. For 10 days 02/12/15   Junius Finner, PA-C  levofloxacin (LEVAQUIN) 500 MG tablet Take 1 tablet (500 mg total) by mouth daily. 12/03/14   Lattie Haw, MD   Meds Ordered and Administered this Visit   Medications  cefTRIAXone (ROCEPHIN) injection 1 g (1 g Intramuscular Given 02/12/15 1725)    BP 106/73 mmHg  Pulse 97  Temp(Src) 98.1 F (36.7 C) (Oral)  Resp 16  Ht 5' 7.5" (1.715 m)  Wt 135 lb (61.236 kg)  BMI 20.82 kg/m2  SpO2 100% No data found.   Physical Exam  Constitutional: She appears well-developed and well-nourished. No distress.  Pt appears well, non-toxic. No acute distress.  HENT:  Head: Normocephalic and atraumatic.  Right Ear: Hearing, tympanic membrane, external ear and ear canal normal.  Left Ear: Hearing, tympanic membrane, external ear and ear canal normal.  Nose: Nose normal.  Mouth/Throat: Uvula is midline, oropharynx is clear and moist and mucous  membranes are normal.  Eyes: Conjunctivae are normal. No scleral icterus.  Neck: Normal range of motion. Neck supple.  Cardiovascular: Normal rate, regular rhythm and normal heart sounds.   Pulmonary/Chest: Effort normal. No respiratory distress. She has wheezes. She has no rhonchi. She has no rales. She exhibits no tenderness.  No respiratory distress.  Faint expiratory wheeze in Left upper lobe.    Abdominal: Soft. Bowel sounds are normal. She exhibits no distension and no mass. There is no  tenderness. There is no rebound and no guarding.  Musculoskeletal: Normal range of motion.  Neurological: She is alert.  Skin: Skin is warm and dry. She is not diaphoretic.  Nursing note and vitals reviewed.   ED Course  Procedures (including critical care time)  Labs Review Labs Reviewed - No data to display  Imaging Review Dg Chest 2 View  02/12/2015  CLINICAL DATA:  Pleuritic type left chest pain. EXAM: CHEST  2 VIEW COMPARISON:  12/03/2014 FINDINGS: There is opacity in the left upper lobe lingula with an apparent cavity containing dependent fluid. This is new since the prior study. Focal opacity noted previously in the right upper lobe now appears is an area of reticular opacity consistent with scarring. There is mild stable scarring in both lung apices. Minimal left pleural effusion is noted.  No right pleural effusion. Cardiac silhouette is normal in size. No mediastinal or hilar masses or convincing adenopathy. Bony thorax is grossly intact. IMPRESSION: 1. Left upper lobe lingula opacity with apparent cavity containing dependent fluid. Pneumonia with necrosis is suspected. Electronically Signed   By: Amie Portlandavid  Ormond M.D.   On: 02/12/2015 16:56      MDM   1. Left upper lobe pneumonia    Pt c/o Left upper chest pain that is worse with cough and deep breathing. Sick contacts at home. Hx of Right upper lobe pneumonia on 12/03/14. Vitals: WNL, O2 Sat 100%, Temp 98.81F on RA.  CXR: positive for Left upper lobe lingula opacity with apparent cavity containing dependent fluid, new since last CXR on 12/03/14  Tx in UC: Rocephin 1g IM  Rx: biaxin as pt was on Levaquin 2 months ago.  She has been on biaxin in the past for pneumonia with good results.  Advised pt to use acetaminophen and ibuprofen as needed for fever and pain. Encouraged rest and fluids. F/u with PCP in 5-7 days if not improving, sooner if worsening. Pt verbalized understanding and agreement with tx plan.   Junius FinnerErin O'Malley,  PA-C 02/12/15 [phone]

## 2015-02-23 ENCOUNTER — Emergency Department
Admission: EM | Admit: 2015-02-23 | Discharge: 2015-02-23 | Disposition: A | Discharge status: Home / Self Care | Payer: BLUE CROSS/BLUE SHIELD | Source: Home / Self Care | Attending: Family Medicine | Admitting: Family Medicine

## 2015-02-23 ENCOUNTER — Encounter: Payer: Self-pay | Admitting: Emergency Medicine

## 2015-02-23 DIAGNOSIS — R05 Cough: Secondary | ICD-10-CM

## 2015-02-23 DIAGNOSIS — R053 Chronic cough: Secondary | ICD-10-CM

## 2015-02-23 LAB — POCT CBC W AUTO DIFF (K'VILLE URGENT CARE)

## 2015-02-23 NOTE — Discharge Instructions (Signed)
Begin prednisone  twice daily for five days.  Take with food. May continue codeine cough suppressant at bedtime. If symptoms become significantly worse during the night or over the weekend, proceed to the local emergency room.

## 2015-02-23 NOTE — ED Provider Notes (Signed)
CSN: [id number]     Arrival date & time 02/23/15  1911 History   None    Chief Complaint  Patient presents with  . Cough      HPI Comments: Patient was diagnosed with left upper lobe lingular pneumonia eleven days ago.  She finished her course of Biaxin yesterday.  She complains of a persistent dry cough.  She sneezed yesterday and developed pleuritic pain pain in her left lateral chest.  She remains fatigued, but does not believe she has had a fever.  She still has mild shortness of breath at times.  No fevers, chills, and sweats.   Past Medical History  Diagnosis Date  . Bronchitis   . Bronchitis   . Pneumonia    Past Surgical History  Procedure Laterality Date  . Tubal ligation    . Tubal ligation     Family History  Problem Relation Age of Onset  . Thyroid disease Mother   . Heart failure Father    Social History  Substance Use Topics  . Smoking status: Former Smoker -- 0.75 packs/day for 10 years    Types: Cigarettes    Quit date: 02/03/1982  . Smokeless tobacco: None  . Alcohol Use: Yes     Comment: 2-3 per wk   OB History    No data available     Review of Systems No sore throat + cough + pleuritic pain No wheezing + nasal congestion + post-nasal drainage No sinus pain/pressure No itchy/red eyes No earache No hemoptysis + SOB No fever/chills No nausea No vomiting No abdominal pain No diarrhea No urinary symptoms No skin rash + fatigue No myalgias No headache    Allergies  Review of patient's allergies indicates no known allergies.  Home Medications   Prior to Admission medications   Medication Sig Start Date End Date Taking? Authorizing Provider  clarithromycin (BIAXIN) 500 MG tablet Take 1 tablet (500 mg total) by mouth 2 (two) times daily. For 10 days 02/12/15   Junius Finner, PA-C  levofloxacin (LEVAQUIN) 500 MG tablet Take 1 tablet (500 mg total) by mouth daily. 12/03/14   Lattie Haw, MD   Meds Ordered and Administered this Visit   Medications - No data to display  BP 147/91 mmHg  Pulse 80  Temp(Src) 98.1 F (36.7 C) (Oral)  Wt 138 lb (62.596 kg)  SpO2 99% No data found.   Physical Exam Nursing notes and Vital Signs reviewed. Appearance:  Patient appears stated age, and in no acute distress Eyes:  Pupils are equal, round, and reactive to light and accomodation.  Extraocular movement is intact.  Conjunctivae are not inflamed  Ears:  Canals normal.  Tympanic membranes normal.  Nose:  Mildly congested turbinates.  No sinus tenderness.   Pharynx:  Normal Neck:  Supple.  No adenopathy Lungs:  Clear to auscultation.  Breath sounds are equal.  Moving air well. Heart:  Regular rate and rhythm without murmurs, rubs, or gallops.  Abdomen:  Nontender without masses or hepatosplenomegaly.  Bowel sounds are present.  No CVA or flank tenderness.  Extremities:  No edema.  Skin:  No rash present.   ED Course  Procedures none    Labs Reviewed  POCT CBC W AUTO DIFF ([facility]):  WBC 4.7; LY 48.0; MO 6.2; GR 45.8; Hgb 12.1; Platelets 391       MDM   1. Persistent cough; suspect post-infectious.  Normal White blood count reassuring.     Begin prednisone   twice daily for five days (patient already has at home).  Take with food. May continue codeine cough suppressant at bedtime. If symptoms become significantly worse during the night or over the weekend, proceed to the local emergency room.  Followup with pulmonologist as soon as possible.    Lattie Haw, MD 02/24/15 [phone]

## 2015-02-23 NOTE — ED Notes (Signed)
Pt dx with pneumonia  1/9. States she finished her abx yesterday and still having dry cough and pain with deep breaths.

## 2015-02-26 ENCOUNTER — Telehealth: Payer: Self-pay | Admitting: Emergency Medicine

## 2015-03-11 ENCOUNTER — Emergency Department (INDEPENDENT_AMBULATORY_CARE_PROVIDER_SITE_OTHER): Payer: BLUE CROSS/BLUE SHIELD

## 2015-03-11 ENCOUNTER — Encounter: Payer: Self-pay | Admitting: Emergency Medicine

## 2015-03-11 ENCOUNTER — Emergency Department
Admission: EM | Admit: 2015-03-11 | Discharge: 2015-03-11 | Disposition: A | Discharge status: Home / Self Care | Payer: BLUE CROSS/BLUE SHIELD | Source: Home / Self Care | Attending: Family Medicine | Admitting: Family Medicine

## 2015-03-11 DIAGNOSIS — R0602 Shortness of breath: Secondary | ICD-10-CM | POA: Diagnosis not present

## 2015-03-11 DIAGNOSIS — R05 Cough: Secondary | ICD-10-CM | POA: Diagnosis not present

## 2015-03-11 DIAGNOSIS — J189 Pneumonia, unspecified organism: Secondary | ICD-10-CM

## 2015-03-11 DIAGNOSIS — J181 Lobar pneumonia, unspecified organism: Principal | ICD-10-CM

## 2015-03-11 LAB — POCT CBC W AUTO DIFF (K'VILLE URGENT CARE)

## 2015-03-11 MED ORDER — CEFDINIR 300 MG PO CAPS: 300.0000 mg | ORAL_CAPSULE | Freq: Two times a day (BID) | ORAL | Status: DC

## 2015-03-11 MED ORDER — CEFTRIAXONE SODIUM 1 G IJ SOLR
1.0000 g | Freq: Once | INTRAMUSCULAR | Status: AC
  Administered 2015-03-11: 1 g via INTRAMUSCULAR

## 2015-03-11 NOTE — Discharge Instructions (Signed)
Take plain guaifenesin (  extended release tabs such as Mucinex) twice daily, with plenty of water, for cough and congestion.   Get adequate rest.   Stop all antihistamines for now, and other non-prescription cough/cold preparations. May continue cough suppressant at bedtime.

## 2015-03-11 NOTE — ED Provider Notes (Signed)
CSN: [id number]     Arrival date & time 03/11/15  1316 History   First MD Initiated Contact with Patient 03/11/15 1502     Chief Complaint  Patient presents with  . Cough  . Chest Pain      HPI Comments: Patient felt that her previous pneumonia had resolved and she felt better for about a week.  During the past four days she has developed increased cough, fatigue, chills, and shortness of breath with activity.  She has developed pleuritic pain in her right upper chest.  The history is provided by the patient.    Past Medical History  Diagnosis Date  . Bronchitis   . Bronchitis   . Pneumonia    Past Surgical History  Procedure Laterality Date  . Tubal ligation    . Tubal ligation     Family History  Problem Relation Age of Onset  . Thyroid disease Mother   . Heart failure Father    Social History  Substance Use Topics  . Smoking status: Former Smoker -- 0.75 packs/day for 10 years    Types: Cigarettes    Quit date: 02/03/1982  . Smokeless tobacco: None  . Alcohol Use: Yes     Comment: 2-3 per wk   OB History    No data available     Review of Systems + sore throat + cough + pleuritic pain right chest No wheezing + nasal congestion + post-nasal drainage No sinus pain/pressure No itchy/red eyes No earache No hemoptysis + SOB with activity No fever, + chills No nausea No vomiting No abdominal pain No diarrhea No urinary symptoms No skin rash + fatigue + myalgias + headache Used OTC meds without relief  Allergies  Review of patient's allergies indicates no known allergies.  Home Medications   Prior to Admission medications   Medication Sig Start Date End Date Taking? Authorizing Provider  cefdinir (OMNICEF) 300 MG capsule Take 1 capsule (300 mg total) by mouth 2 (two) times daily. 03/11/15   Lattie Haw, MD   Meds Ordered and Administered this Visit   Medications  cefTRIAXone (ROCEPHIN) injection 1 g (not administered)    BP 110/75 mmHg   Pulse 104  Temp(Src) 99.3 F (37.4 C) (Oral)  Resp 16  Ht 5' 7.5" (1.715 m)  Wt 135 lb (61.236 kg)  BMI 20.82 kg/m2  SpO2 98% No data found.   Physical Exam Nursing notes and Vital Signs reviewed. Appearance:  Patient appears stated age, and in no acute distress Eyes:  Pupils are equal, round, and reactive to light and accomodation.  Extraocular movement is intact.  Conjunctivae are not inflamed  Ears:  Canals normal.  Tympanic membranes normal.  Nose:  Mildly congested turbinates.  No sinus tenderness.   Pharynx:  Normal Neck:  Supple.  No adenopathy Lungs:  Clear to auscultation.  Breath sounds are equal.  Moving air well. Heart:  Regular rate and rhythm without murmurs, rubs, or gallops.  Abdomen:  Nontender without masses or hepatosplenomegaly.  Bowel sounds are present.  No CVA or flank tenderness.  Extremities:  No edema.  Skin:  No rash present.   ED Course  Procedures  None    Labs Reviewed  POCT CBC W AUTO DIFF ([facility]):  WBC 12.1; LY 16.6; MO 7.7; GR 75.7; Hgb 12.7; Platelets 210     Imaging Review Dg Chest 2 View  03/11/2015  CLINICAL DATA:  Cough, shortness of breath. EXAM: CHEST  2 VIEW COMPARISON:  February 12, 2015. FINDINGS: The heart size and mediastinal contours are within normal limits. No pneumothorax or pleural effusion is noted. Improved density is seen in lingular region concerning for improving inflammation. New ill-defined rounded density is seen in right midlung concerning for probable pneumonia. The visualized skeletal structures are unremarkable. IMPRESSION: Decrease lingular opacity is noted concerning for improving inflammation or atelectasis, with possible residual scarring. New right midlung opacity is noted most consistent with focal pneumonia, but short-term followup radiographs are recommended to ensure resolution and rule out underlying neoplasm. Electronically Signed   By: Lupita Raider, M.D.   On: 03/11/2015 14:01      MDM   1.  Right middle lobe pneumonia    Recurrent pneumonia worrisome. Administered Rocephin 1gm IM Begin Omnicef  BID Take plain guaifenesin (  extended release tabs such as Mucinex) twice daily, with plenty of water, for cough and congestion.   Get adequate rest.   Stop all antihistamines for now, and other non-prescription cough/cold preparations. May continue cough suppressant at bedtime. Followup with pulmonologist as soon as possible.     Lattie Haw, MD 03/15/15 1041

## 2015-03-11 NOTE — ED Notes (Signed)
Reports clear of pneumonia symptoms diagnosed 02/12/15 for about a week; few days ago began coughing again and this time has pain in right upper lung area.

## 2015-03-16 ENCOUNTER — Telehealth: Payer: Self-pay

## 2015-04-08 ENCOUNTER — Emergency Department
Admission: EM | Admit: 2015-04-08 | Discharge: 2015-04-08 | Disposition: A | Discharge status: Home / Self Care | Payer: BLUE CROSS/BLUE SHIELD | Source: Home / Self Care | Attending: Family Medicine | Admitting: Family Medicine

## 2015-04-08 ENCOUNTER — Encounter: Payer: Self-pay | Admitting: Emergency Medicine

## 2015-04-08 ENCOUNTER — Emergency Department (INDEPENDENT_AMBULATORY_CARE_PROVIDER_SITE_OTHER): Payer: BLUE CROSS/BLUE SHIELD

## 2015-04-08 DIAGNOSIS — J209 Acute bronchitis, unspecified: Secondary | ICD-10-CM | POA: Diagnosis not present

## 2015-04-08 DIAGNOSIS — J189 Pneumonia, unspecified organism: Secondary | ICD-10-CM

## 2015-04-08 LAB — POCT CBC W AUTO DIFF (K'VILLE URGENT CARE)

## 2015-04-08 MED ORDER — DOXYCYCLINE HYCLATE 100 MG PO CAPS: 100.0000 mg | ORAL_CAPSULE | Freq: Two times a day (BID) | ORAL | Status: DC

## 2015-04-08 NOTE — ED Provider Notes (Signed)
CSN: [id number]     Arrival date & time 04/08/15  1447 History   First MD Initiated Contact with Patient 04/08/15 1600     Chief Complaint  Patient presents with  . Pneumonia      HPI Comments: Patient felt better after being treated for pneumonia 03/11/15.  She was followed up by her pulmonologist and underwent CT pulmonary angiogram on 03/24/2015 that revealed right middle lobe consolidation, and inflammation/scarring in the right upper lobe less prominent.  One week ago she developed a sore throat.  Yesterday she developed increased cough, fatigue, myalgias, and right pleuritic pain.  She has had wheezing and mild shortness of breath but no fevers, chills, and sweats.  The history is provided by the patient.    Past Medical History  Diagnosis Date  . Bronchitis   . Bronchitis   . Pneumonia    Past Surgical History  Procedure Laterality Date  . Tubal ligation    . Tubal ligation     Family History  Problem Relation Age of Onset  . Thyroid disease Mother   . Heart failure Father    Social History  Substance Use Topics  . Smoking status: Former Smoker -- 0.75 packs/day for 10 years    Types: Cigarettes    Quit date: 02/03/1982  . Smokeless tobacco: None  . Alcohol Use: Yes     Comment: 2-3 per wk   OB History    No data available     Review of Systems + sore throat + cough + pleuritic pain + wheezing + nasal congestion + post-nasal drainage No sinus pain/pressure No itchy/red eyes No earache No hemoptysis + SOB No fever/chills No nausea No vomiting No abdominal pain No diarrhea No urinary symptoms No skin rash + fatigue + myalgias + headache Used OTC meds without relief  Allergies  Review of patient's allergies indicates no known allergies.  Home Medications   Prior to Admission medications   Medication Sig Start Date End Date Taking? Authorizing Provider  acetaminophen (TYLENOL) 650 MG CR tablet Take 650 mg by mouth every 8 (eight) hours as needed  for pain.   Yes Historical Provider, MD  GuaiFENesin (MUCINEX PO) Take by mouth.   Yes Historical Provider, MD   Meds Ordered and Administered this Visit  Medications - No data to display  BP 100/69 mmHg  Pulse 84  Temp(Src) 98.2 F (36.8 C) (Oral)  Ht 5\' 7"  (1.702 m)  Wt 139 lb 8 oz (63.277 kg)  BMI 21.84 kg/m2  SpO2 100% No data found.   Physical Exam Nursing notes and Vital Signs reviewed. Appearance:  Patient appears stated age, and in no acute distress Eyes:  Pupils are equal, round, and reactive to light and accomodation.  Extraocular movement is intact.  Conjunctivae are not inflamed  Ears:  Canals normal.  Tympanic membranes normal.  Nose:  Mildly congested turbinates.  No sinus tenderness.   Pharynx:  Normal Neck:  Supple.  Tender enlarged posterior nodes are palpated bilaterally  Lungs:  Clear to auscultation.  Breath sounds are equal.  Moving air well. Heart:  Regular rate and rhythm without murmurs, rubs, or gallops.  Abdomen:  Nontender without masses or hepatosplenomegaly.  Bowel sounds are present.  No CVA or flank tenderness.  Extremities:  No edema.  Skin:  No rash present.   ED Course  Procedures none    Labs Reviewed  POCT CBC W AUTO DIFF ([facility]):  WBC 8.3; LY 22.0; MO 10.6;  GR 67.4; Hgb 12.6; Platelets 208      Imaging Review Dg Chest 2 View  04/08/2015  CLINICAL DATA:  Diagnosed with pneumonia on 03/11/2015 on RIGHT, began having recurrent RIGHT side chest pain and cough last night, former smoker EXAM: CHEST  2 VIEW COMPARISON:  03/11/2015 FINDINGS: Normal heart size, mediastinal contours and pulmonary vascularity. Bronchitic and mild emphysematous changes question COPD. Persistent versus recurrent infiltrate at RIGHT middle lobe consistent with pneumonia. Remaining lungs clear. Small focus of scarring at the RIGHT upper lobe is unchanged. No pleural effusion or pneumothorax. Bones demineralized. 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 Electronically Signed   By: Ulyses Southward M.D.   On: 04/08/2015 15:58     MDM   1. Acute bronchitis, unspecified organism; vs ?persistent pneumonia.  Normal white blood count is reassuring.    Begin doxycycline  BID for atypical coverage. Continue plain guaifenesin (  extended release tabs such as Mucinex) twice daily, with plenty of water, for cough and congestion.    Get adequate rest.   May take cough suppressant at bedtime as needed. Stop all antihistamines for now, and other non-prescription cough/cold preparations. Followup with pulmonologist in about 10 days.    Lattie Haw, MD 04/10/15 1054

## 2015-04-08 NOTE — ED Notes (Signed)
Pt has a hx of pnuemonia, had rt lung pnuemonia in February and she is starting to feel the same way again, cough.

## 2015-04-08 NOTE — Discharge Instructions (Signed)
Continue plain guaifenesin (1200mg  extended release tabs such as Mucinex) twice daily, with plenty of water, for cough and congestion.    Get adequate rest.   May take cough suppressant at bedtime as needed. Stop all antihistamines for now, and other non-prescription cough/cold preparations.

## 2015-05-12 ENCOUNTER — Emergency Department
Admission: EM | Admit: 2015-05-12 | Discharge: 2015-05-12 | Disposition: A | Discharge status: Home / Self Care | Payer: BLUE CROSS/BLUE SHIELD | Source: Home / Self Care | Attending: Family Medicine | Admitting: Family Medicine

## 2015-05-12 ENCOUNTER — Encounter: Payer: Self-pay | Admitting: Emergency Medicine

## 2015-05-12 DIAGNOSIS — H9201 Otalgia, right ear: Secondary | ICD-10-CM

## 2015-05-12 DIAGNOSIS — R0981 Nasal congestion: Secondary | ICD-10-CM | POA: Diagnosis not present

## 2015-05-12 DIAGNOSIS — J029 Acute pharyngitis, unspecified: Secondary | ICD-10-CM

## 2015-05-12 LAB — POCT RAPID STREP A (OFFICE): Rapid Strep A Screen: NEGATIVE

## 2015-05-12 NOTE — ED Provider Notes (Signed)
CSN: [id number]     Arrival date & time 05/12/15  1405 History   First MD Initiated Contact with Patient 05/12/15 1425     Chief Complaint  Patient presents with  . Sore Throat   (Consider location/radiation/quality/duration/timing/severity/associated sxs/prior Treatment) HPI  The pt is a 65yo female presenting to [facility] with c/o intermittent sore scratchy throat for about 1 month but woke this morning and throat pain was more severe, 8/10 today.  Pain is aching and burning, worse with swallowing, associated Right ear pain, mild nasal congestion and watery eyes. She was thinking symptoms were due to allergies so she has been taking Zyrtec and started to use Flonase and an OTC eye drop but no relief of symptoms.  Denies fever, chills, n/v/d. Denies hx of acid reflux.  She notes her dentist is just getting over throat cancer and she is concerned due to the persistence of her throat pain.  She is a former smoker.  She reports having an endoscopy this summer and everything was normal at that time. Denies difficulty breathing or swallowing.  Past Medical History  Diagnosis Date  . Bronchitis   . Bronchitis   . Pneumonia    Past Surgical History  Procedure Laterality Date  . Tubal ligation    . Tubal ligation     Family History  Problem Relation Age of Onset  . Thyroid disease Mother   . Heart failure Father    Social History  Substance Use Topics  . Smoking status: Former Smoker -- 0.75 packs/day for 10 years    Types: Cigarettes    Quit date: 02/03/1982  . Smokeless tobacco: None  . Alcohol Use: Yes     Comment: 2-3 per wk   OB History    No data available     Review of Systems  Constitutional: Negative for fever and chills.  HENT: Positive for congestion, ear pain (Right), rhinorrhea and sore throat. Negative for trouble swallowing and voice change.   Respiratory: Negative for cough and shortness of breath.   Cardiovascular: Negative for chest pain and palpitations.   Gastrointestinal: Negative for nausea, vomiting, abdominal pain and diarrhea.  Musculoskeletal: Negative for myalgias, back pain and arthralgias.  Skin: Negative for rash.    Allergies  Review of patient's allergies indicates no known allergies.  Home Medications   Prior to Admission medications   Medication Sig Start Date End Date Taking? Authorizing Provider  acetaminophen (TYLENOL) 650 MG CR tablet Take 650 mg by mouth every 8 (eight) hours as needed for pain.    Historical Provider, MD  doxycycline (VIBRAMYCIN) 100 MG capsule Take 1 capsule (100 mg total) by mouth 2 (two) times daily. Take with food. 04/08/15   Lattie HawStephen A Beese, MD  GuaiFENesin (MUCINEX PO) Take by mouth.    Historical Provider, MD   Meds Ordered and Administered this Visit  Medications - No data to display  Pulse 74  Temp(Src) 98.1 F (36.7 C) (Oral)  Resp 13  Ht 5\' 7"  (1.702 m)  Wt 142 lb (64.411 kg)  BMI 22.24 kg/m2  SpO2 100% No data found.   Physical Exam  Constitutional: She appears well-developed and well-nourished. No distress.  HENT:  Head: Normocephalic and atraumatic.  Right Ear: A middle ear effusion is present.  Left Ear: Tympanic membrane normal.  Nose: Rhinorrhea present. Right sinus exhibits no maxillary sinus tenderness and no frontal sinus tenderness. Left sinus exhibits no maxillary sinus tenderness and no frontal sinus tenderness.  Mouth/Throat: Uvula is midline  and mucous membranes are normal. No trismus in the jaw. No uvula swelling. Posterior oropharyngeal erythema present. No oropharyngeal exudate, posterior oropharyngeal edema or tonsillar abscesses.  Eyes: Conjunctivae are normal. No scleral icterus.  Neck: Normal range of motion. Neck supple.  Cardiovascular: Normal rate, regular rhythm and normal heart sounds.   Pulmonary/Chest: Effort normal and breath sounds normal. No stridor. No respiratory distress. She has no wheezes. She has no rales.  Abdominal: Soft. She exhibits no  distension. There is no tenderness.  Musculoskeletal: Normal range of motion.  Lymphadenopathy:    She has no cervical adenopathy.  Neurological: She is alert.  Skin: Skin is warm and dry. She is not diaphoretic.  Nursing note and vitals reviewed.   ED Course  Procedures (including critical care time)  Labs Review Labs Reviewed  STREP A DNA PROBE  POCT RAPID STREP A (OFFICE)    Imaging Review No results found.    MDM   1. Sore throat   2. Ear pain, right   3. Nasal congestion    Pt c/o intermittent sore throat for 1 month, worse this morning.  Mild oral pharyngeal erythema w/o edema or exudates. No evidence of peritonsillar abscess. No airway obstruction.  Rapid strep: Negative Will send culture.  Reassured pt oral exam not concerning at this time, however, encouraged pt to mention throat pain during dentist appointment later this week as most dentist are trained to screen for oral cancers. Also encouraged to f/u with PCP in 1 week if symptoms not improving. She may continue to use Zyrtec and Flonase. Saltwater gargles and OTC chloraseptic spray for throat pain. Alternate acetaminophen and ibuprofen for pain.   Patient verbalized understanding and agreement with treatment plan.     Junius Finner, PA-C 05/12/15 1456

## 2015-05-12 NOTE — ED Notes (Signed)
Patient C/O an intermittent sore throat for one month, stated she woke this morning with symptoms worse. Denies fever

## 2015-05-12 NOTE — Discharge Instructions (Signed)
You may try over the counter chloraseptic spray before and/or after meals as needed for throat pain.  Throat lozenges may also provide some pain relief.  Please be sure to follow up with your primary care provider in 1 week if not improving, sooner if worsening.  The strep test today in the office was negative but a culture has been sent.  This test is a little more accurate but takes 2-3 days for results to come back.  If you need an antibiotic, you will be notified and the medication can be called in for you.

## 2015-05-13 LAB — STREP A DNA PROBE: GASP: NOT DETECTED

## 2015-05-15 ENCOUNTER — Telehealth: Payer: Self-pay | Admitting: *Deleted

## 2017-01-28 DIAGNOSIS — J0100 Acute maxillary sinusitis, unspecified: Secondary | ICD-10-CM | POA: Insufficient documentation

## 2017-02-23 DIAGNOSIS — M2241 Chondromalacia patellae, right knee: Secondary | ICD-10-CM | POA: Insufficient documentation

## 2017-02-23 DIAGNOSIS — M25561 Pain in right knee: Secondary | ICD-10-CM | POA: Insufficient documentation

## 2017-12-22 IMAGING — CR DG CHEST 2V
2 series · 2 of 2 positions shown · non-contrast
Comparison: 12/03/2014

CLINICAL DATA: Pleuritic type left chest pain.

EXAM:
CHEST  2 VIEW

[chest pa]
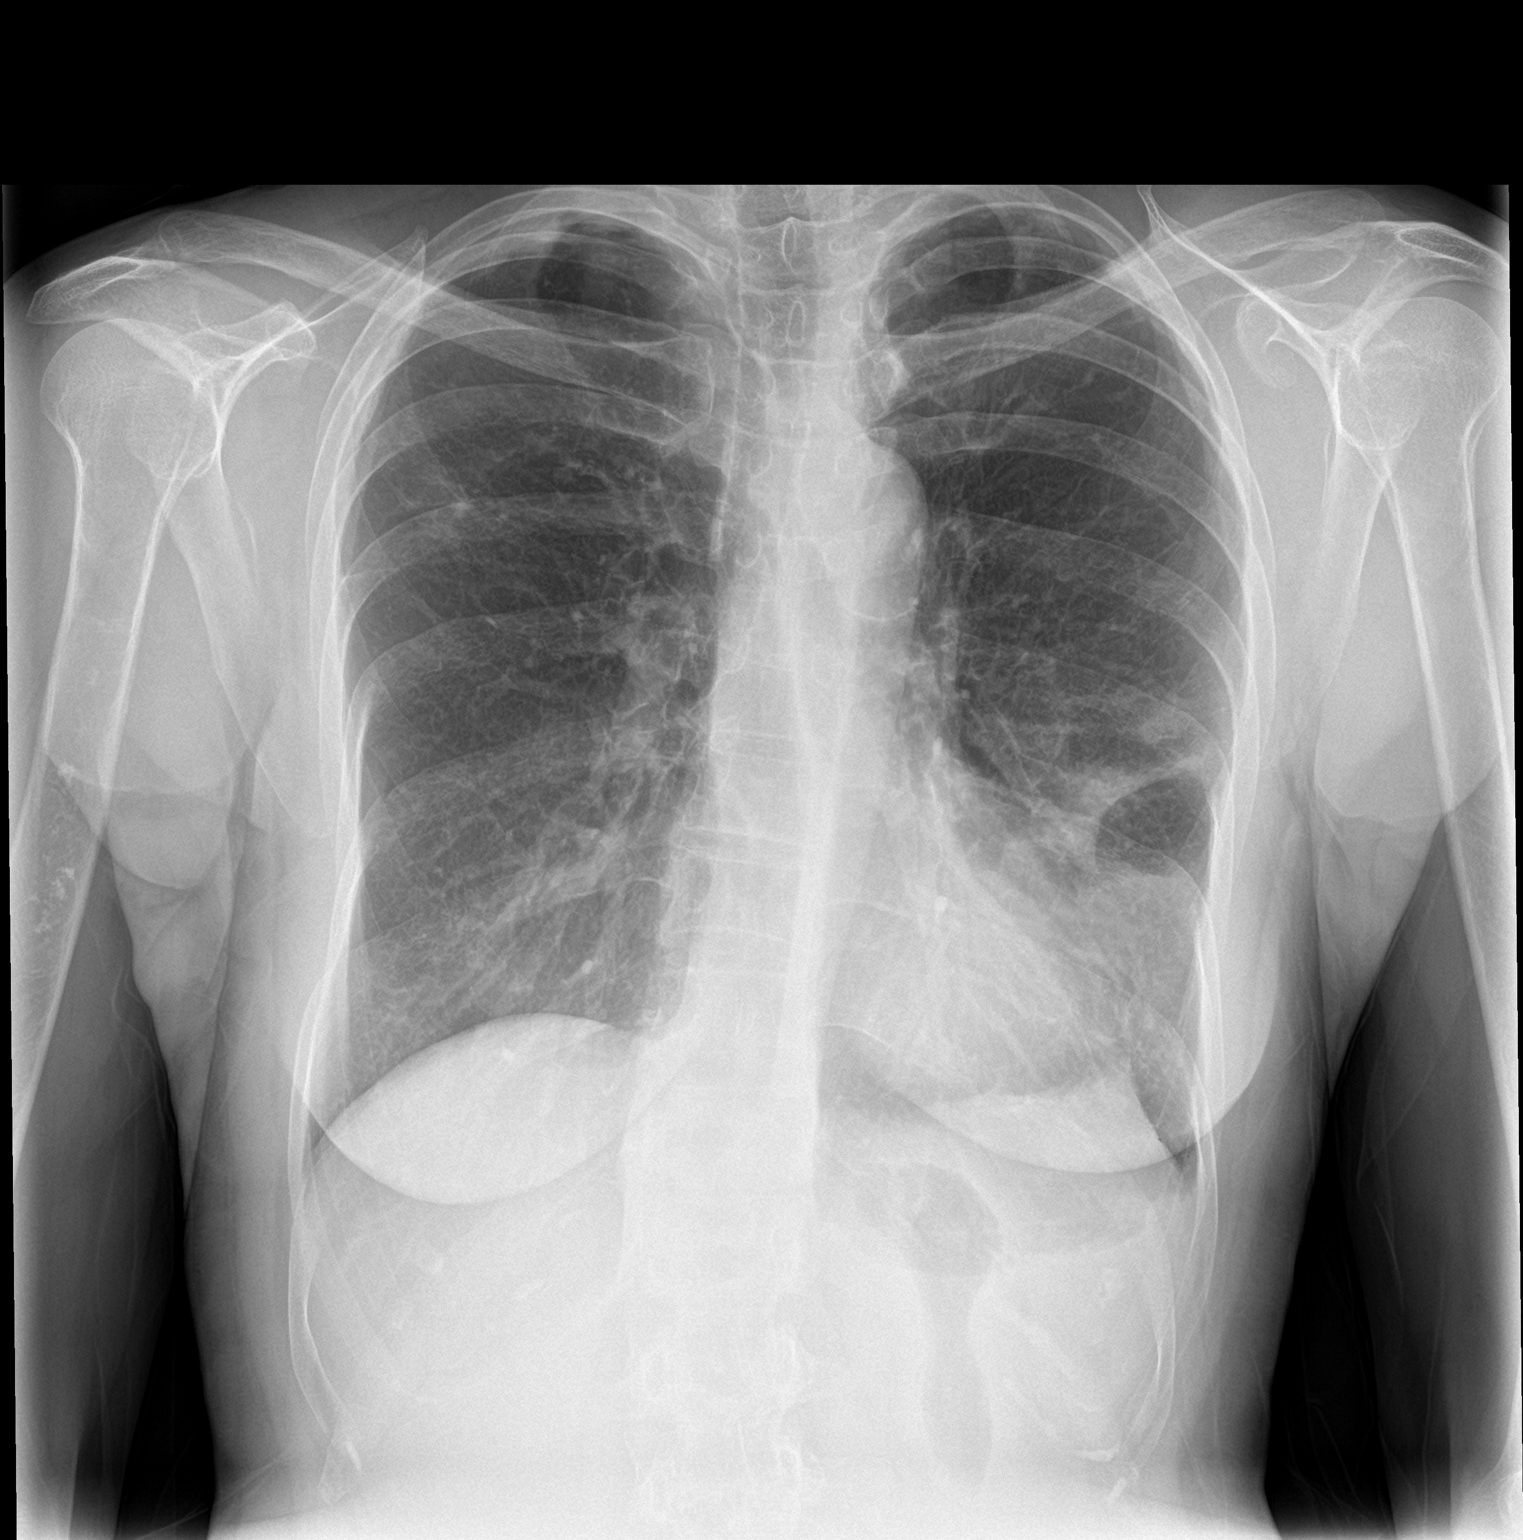

[chest lat]
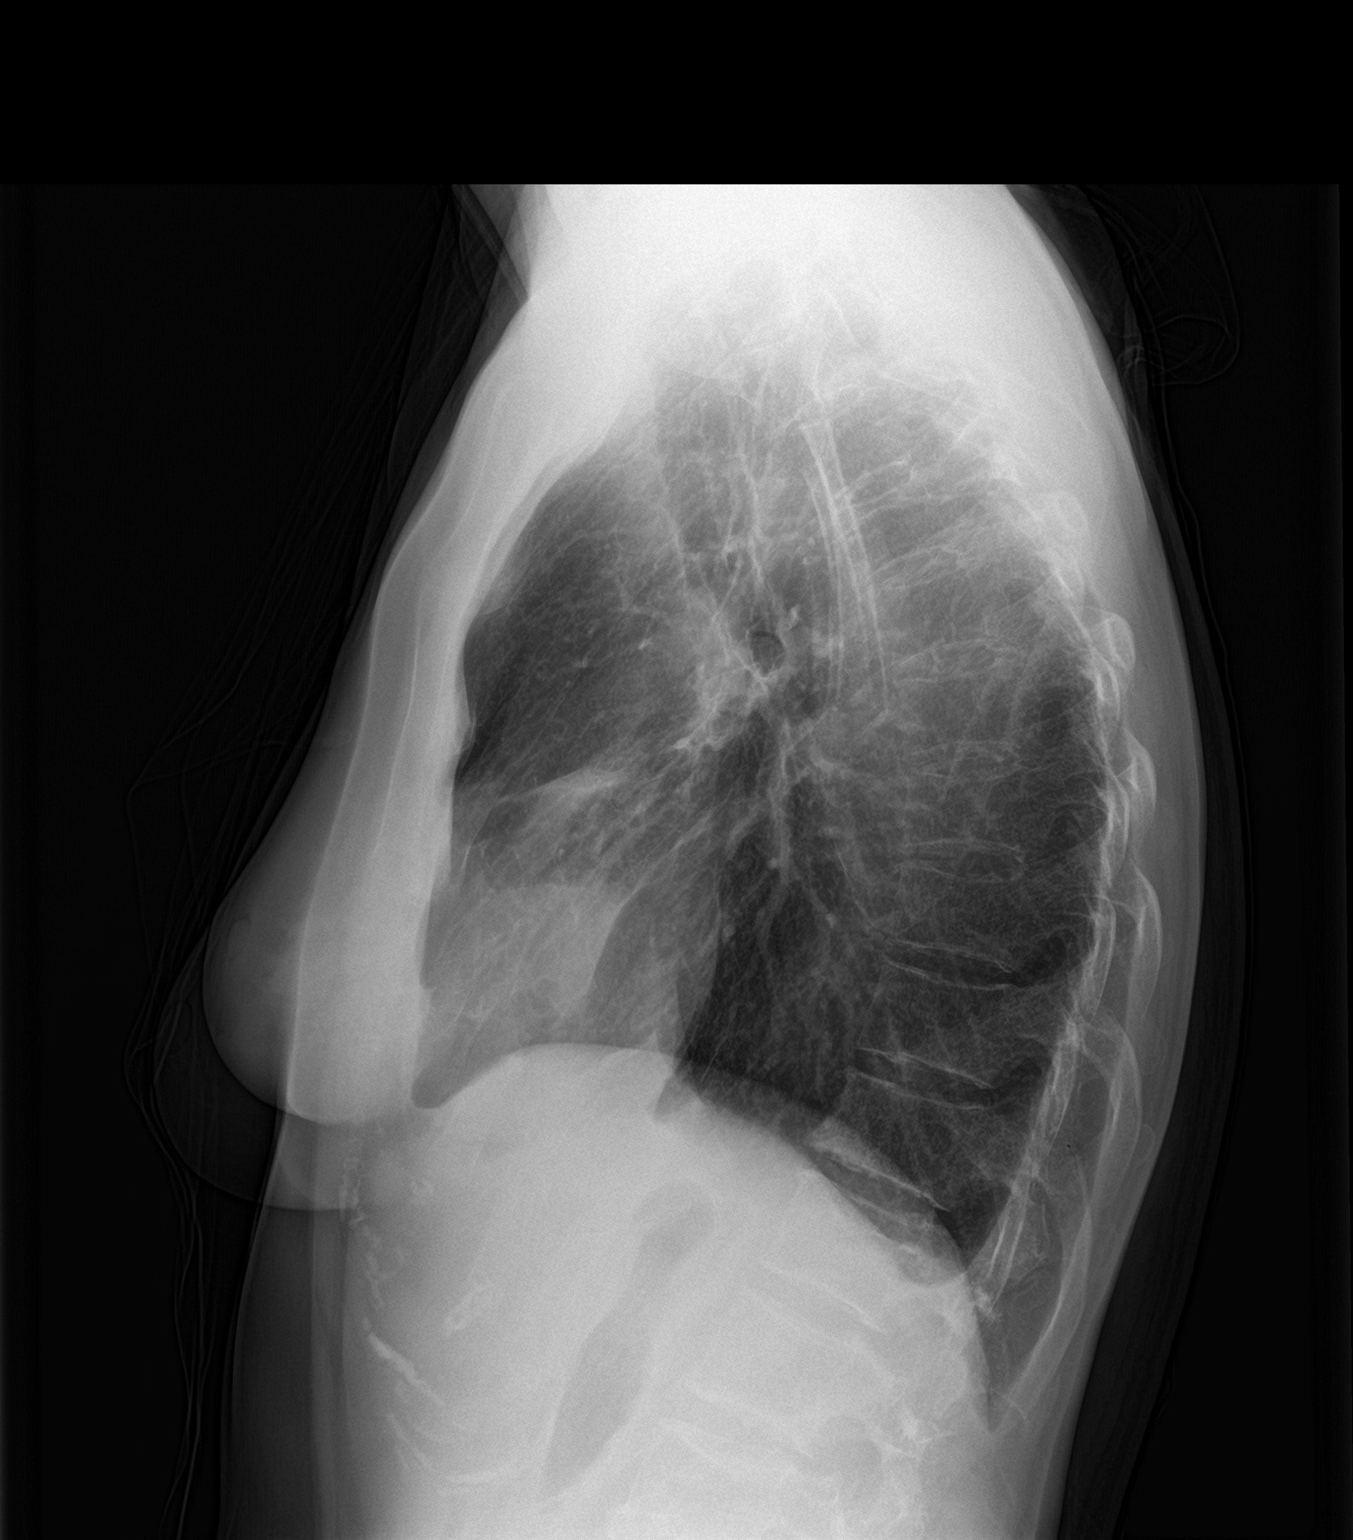

[2 of 2 positions shown; findings below may reference images not displayed]

FINDINGS: There is opacity in the left upper lobe lingula with an apparent
cavity containing dependent fluid. This is new since the prior
study.

Focal opacity noted previously in the right upper lobe now appears
is an area of reticular opacity consistent with scarring. There is
mild stable scarring in both lung apices.

Minimal left pleural effusion is noted.  No right pleural effusion.

Cardiac silhouette is normal in size. No mediastinal or hilar masses
or convincing adenopathy.

Bony thorax is grossly intact.
IMPRESSION: 1. Left upper lobe lingula opacity with apparent cavity containing
dependent fluid. Pneumonia with necrosis is suspected.

## 2018-02-15 IMAGING — CR DG CHEST 2V
2 series · 2 of 2 positions shown · non-contrast
Comparison: 03/11/2015

CLINICAL DATA: Diagnosed with pneumonia on 03/11/2015 on RIGHT,
began having recurrent RIGHT side chest pain and cough last night,
former smoker

EXAM:
CHEST  2 VIEW

[chest pa]
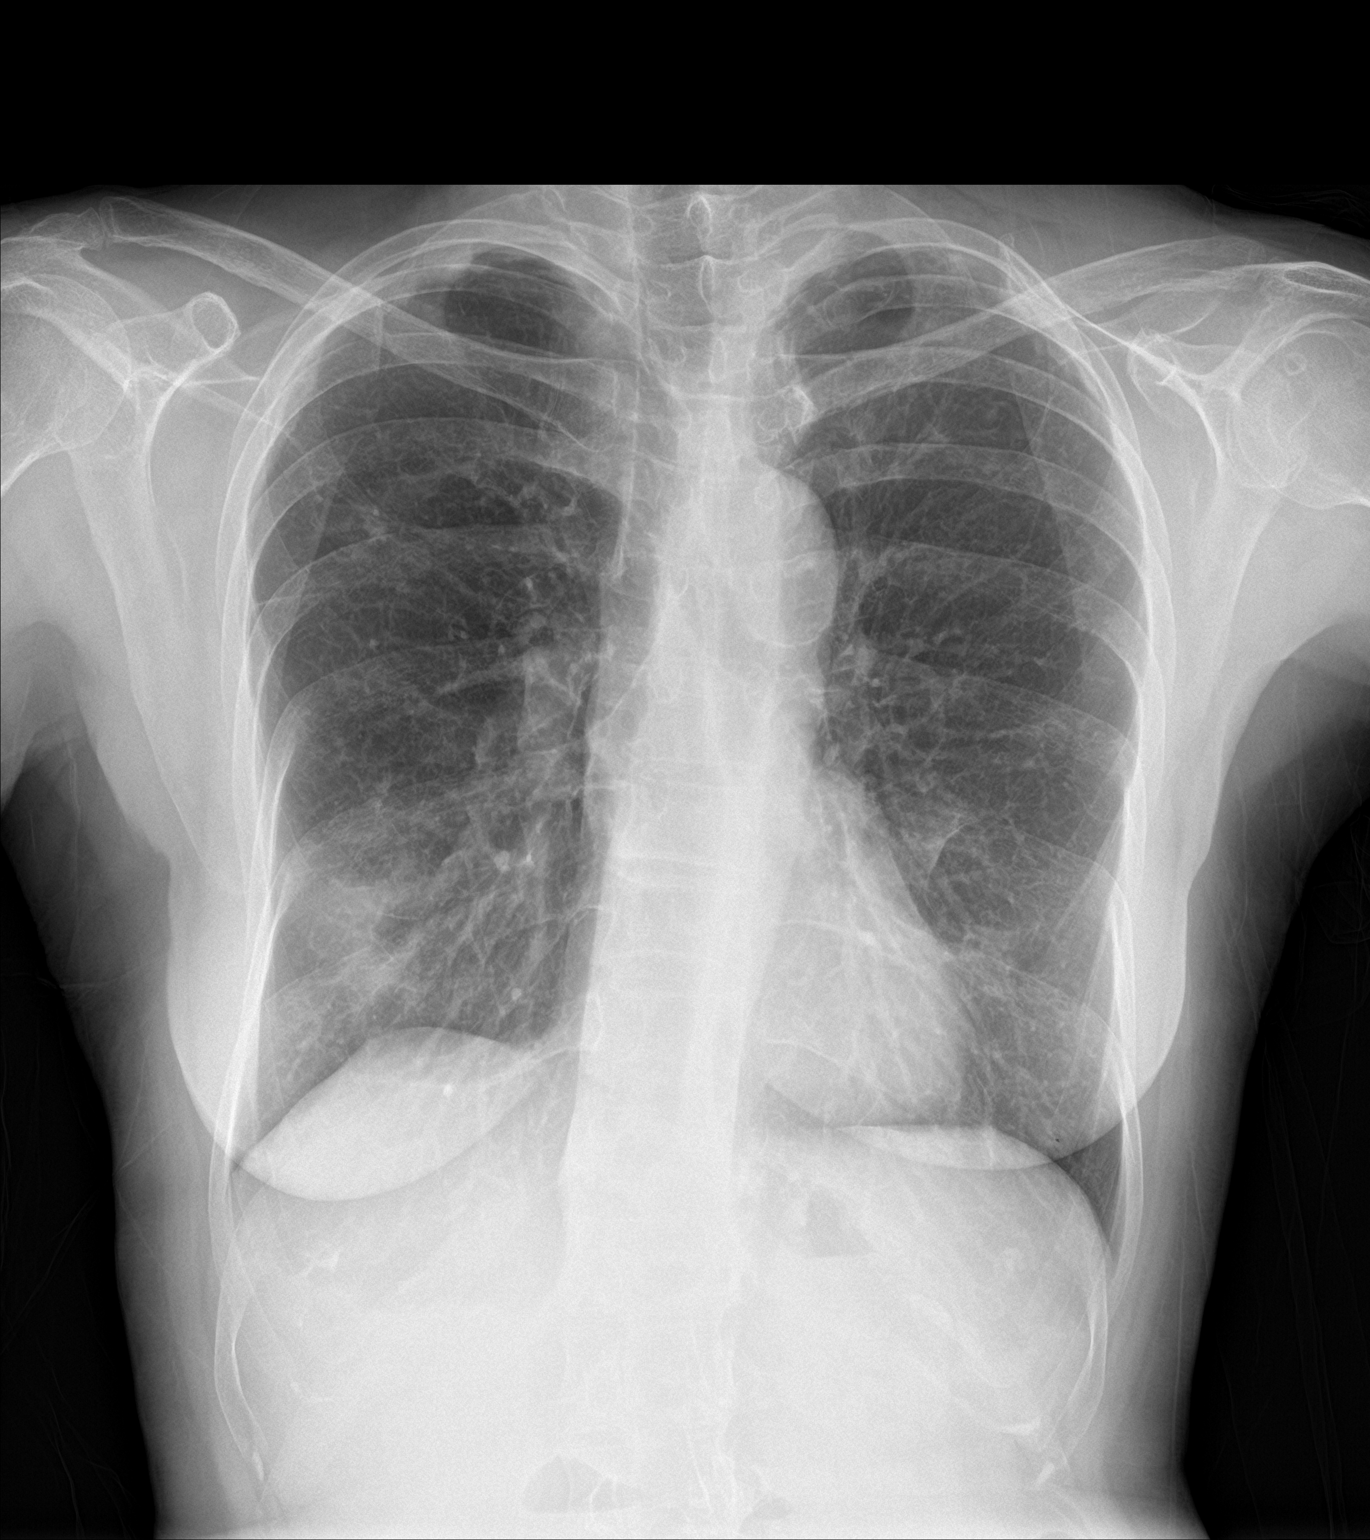

[chest lat]
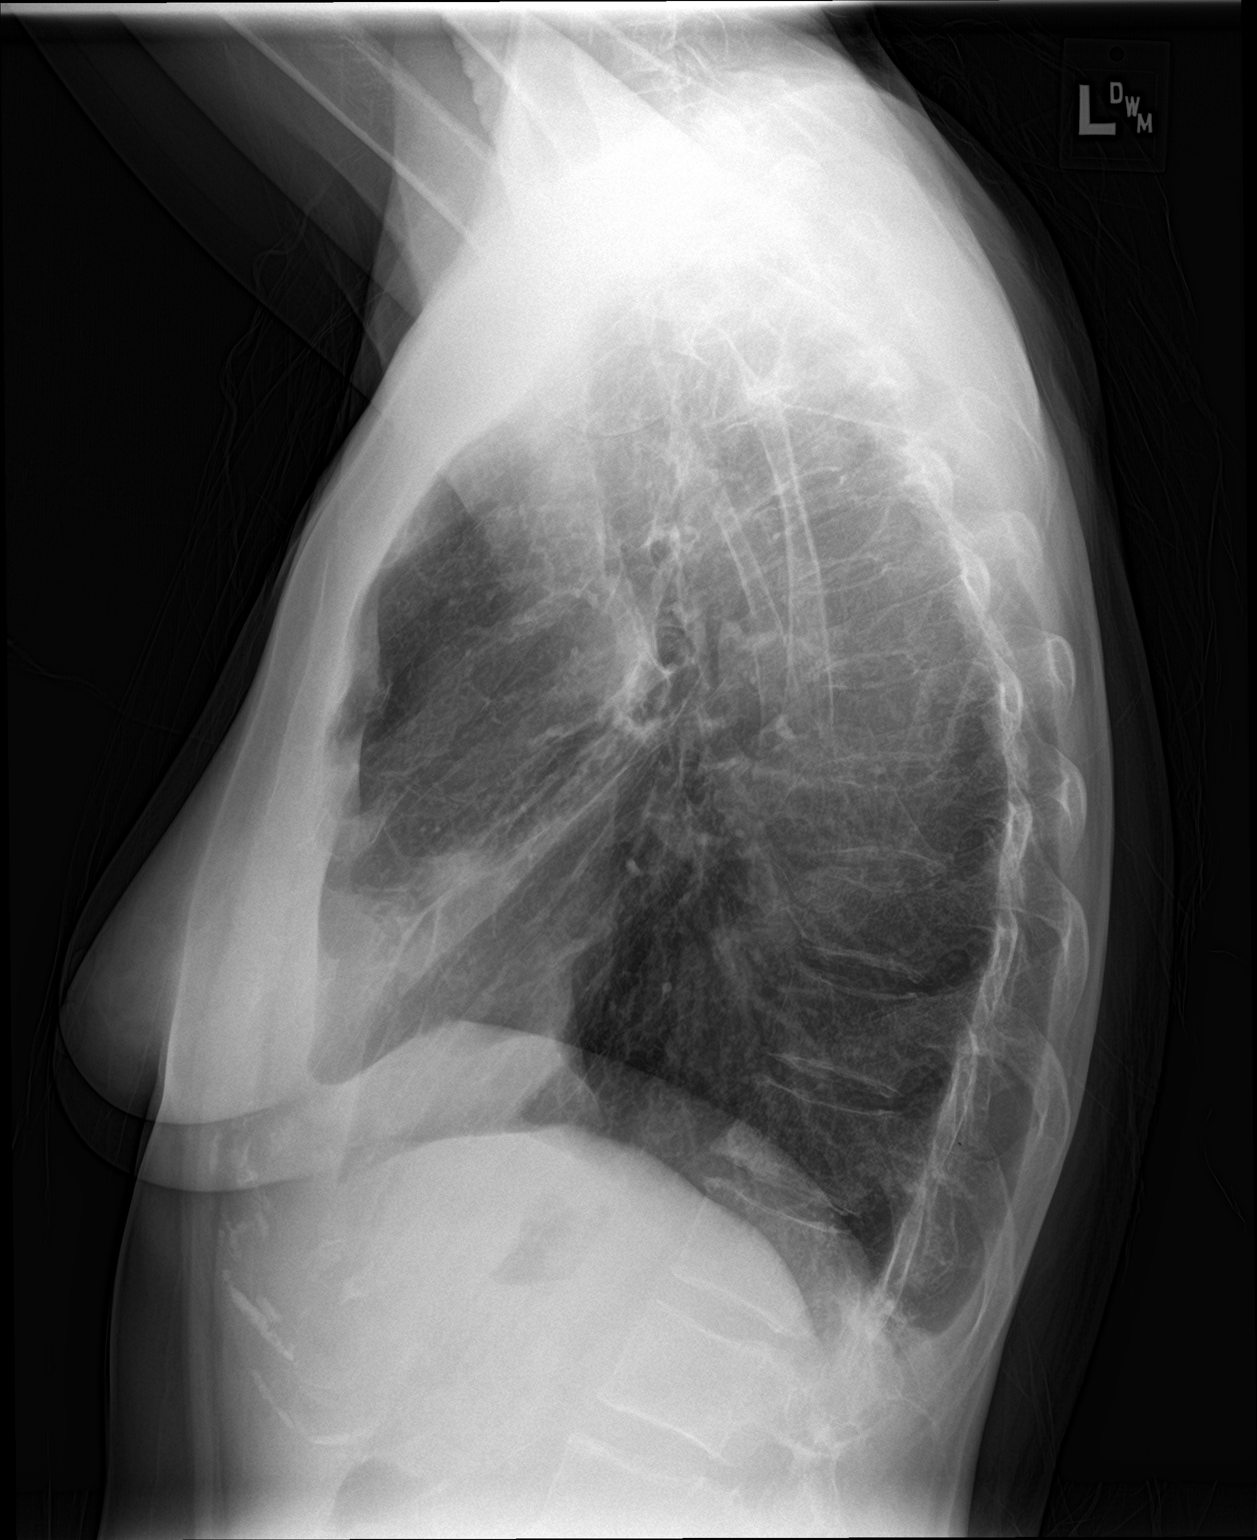

[2 of 2 positions shown; findings below may reference images not displayed]

FINDINGS: Normal heart size, mediastinal contours and pulmonary vascularity.

Bronchitic and mild emphysematous changes question COPD.

Persistent versus recurrent infiltrate at RIGHT middle lobe
consistent with pneumonia.

Remaining lungs clear.

Small focus of scarring at the RIGHT upper lobe is unchanged.

No pleural effusion or pneumothorax.

Bones demineralized.
IMPRESSION: Probable COPD changes with persistent versus recurrent RIGHT middle
lobe pneumonia.

## 2018-08-27 ENCOUNTER — Other Ambulatory Visit: Payer: Self-pay

## 2018-08-27 DIAGNOSIS — Z20822 Contact with and (suspected) exposure to covid-19: Secondary | ICD-10-CM

## 2018-08-31 LAB — NOVEL CORONAVIRUS, NAA: SARS-CoV-2, NAA: NOT DETECTED

## 2018-10-18 ENCOUNTER — Other Ambulatory Visit: Payer: Self-pay

## 2018-10-18 ENCOUNTER — Encounter: Payer: Self-pay | Admitting: Pulmonary Disease

## 2018-10-18 ENCOUNTER — Ambulatory Visit: Payer: Medicare Other | Admitting: Pulmonary Disease

## 2018-10-18 VITALS — BP 128/80 | HR 56 | Ht 68.0 in | Wt 138.6 lb

## 2018-10-18 DIAGNOSIS — G4719 Other hypersomnia: Secondary | ICD-10-CM

## 2018-10-18 NOTE — Patient Instructions (Signed)
Excessive daytime sleepiness  Sleep log for at least 2 weeks Use of caffeinated beverage/caffeine supplements-use prior to hour of significant sleepiness  We may consider obtaining an multiple sleep latency test-testing for narcolepsy  Try and obtain 8 hours of sleep every night if possible  I will see you back in the office in 4 weeks

## 2018-10-18 NOTE — Progress Notes (Signed)
Subjective:     Patient ID: Alexandra Rodriguez, female   DOB: 05-Dec-1950, 68 y.o.   MRN: [id number]  Patient with a history of excessive daytime sleepiness  Can fall asleep easily during the day  She feels she is somebody that needs about 8 hours of sleep at night On days when she gets 8 hours of sleep-usually feels relatively well during the day Tries to go to bed at between 1 and 2 AM-she is a night owl Takes about 10 minutes to fall asleep Does not wake up during the night final awakening time about 8:52 AM  Denies any dryness of her mouth in the mornings She does snore  No witnessed apneas No memory issues  She states that most members of her family have difficulty going to bed early-all described as 9 hours Most of them can fall asleep very easily during the day  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 Bronchitis   . Bronchitis   . Pneumonia    Social History   Socioeconomic History  . Marital status: Married    Spouse name: Not on file  . Number of children: Not on file  . Years of education: Not on file  . Highest education level: Not on file  Occupational History  . Not on file  Social Needs  . Financial resource strain: Not on file  . Food insecurity    Worry: Not on file    Inability: Not on file  . Transportation needs    Medical: Not on file    Non-medical: Not on file  Tobacco Use  . Smoking status: Former Smoker    Packs/day: 0.75    Years: 10.00    Pack years: 7.50    Types: Cigarettes    Quit date: 02/03/1982    Years since quitting: 36.7  . Smokeless tobacco: Never Used  Substance and Sexual Activity  . Alcohol use: Yes    Comment: 2-3  per wk  . Drug use: No  . Sexual activity: Not on file  Lifestyle  . Physical activity    Days per week: Not on file    Minutes per session: Not on file  . Stress: Not on file  Relationships  . Social Musicianconnections    Talks on phone: Not on file    Gets together: Not on file    Attends religious service: Not on file    Active member of club or organization: Not on file    Attends meetings of clubs or organizations: Not on file    Relationship status: Not on file  . Intimate partner violence    Fear of current or ex partner: Not on file    Emotionally abused: Not on file    Physically abused: Not on file    Forced sexual activity: Not on file  Other Topics Concern  . Not on file  Social History Narrative  . Not on file   Family History  Problem Relation Age of Onset  . Thyroid disease Mother   . Heart failure Father     Review of Systems  Constitutional: Negative for fever and unexpected weight change.  HENT: Negative for congestion,  dental problem, ear pain, nosebleeds, postnasal drip, rhinorrhea, sinus pressure, sneezing, sore throat and trouble swallowing.   Eyes: Negative for redness and itching.  Respiratory: Negative for cough, chest tightness, shortness of breath and wheezing.   Cardiovascular: Negative for palpitations and leg swelling.  Gastrointestinal: Negative for nausea and vomiting.  Genitourinary: Negative for dysuria.  Musculoskeletal: Negative for joint swelling.  Skin: Negative for rash.  Allergic/Immunologic: Positive for environmental allergies. Negative for food allergies and immunocompromised state.  Neurological: Negative for headaches.  Hematological: Does not bruise/bleed easily.  Psychiatric/Behavioral: Negative for dysphoric mood. The patient is not nervous/anxious.        Objective:   Physical Exam Constitutional:      Appearance: Normal appearance.  HENT:     Head: Normocephalic and atraumatic.     Mouth/Throat:     Mouth: Mucous  membranes are moist.  Eyes:     General:        Right eye: No discharge.        Left eye: No discharge.  Neck:     Musculoskeletal: Normal range of motion and neck supple. No neck rigidity or muscular tenderness.  Cardiovascular:     Rate and Rhythm: Normal rate and regular rhythm.     Pulses: Normal pulses.     Heart sounds: Normal heart sounds. No murmur. No friction rub.  Pulmonary:     Effort: Pulmonary effort is normal. No respiratory distress.     Breath sounds: Normal breath sounds. No stridor. No wheezing or rhonchi.  Abdominal:     General: Abdomen is flat. There is no distension.  Musculoskeletal: Normal range of motion.  Skin:    General: Skin is warm and dry.  Neurological:     Mental Status: She is alert.    Results of the Epworth flowsheet 10/18/2018  Sitting and reading 3  Watching TV 3  Sitting, inactive in a public place (e.g. a theatre or a meeting) 3  As a passenger in a car for an hour without a break 2  Lying down to rest in the afternoon when circumstances permit 3  Sitting and talking to someone 0  Sitting quietly after a lunch without alcohol 2  In a car, while stopped for a few minutes in traffic 2  Total score 18      Assessment:     Severe daytime sleepiness  Symptoms are not suggestive of significant sleep disordered breathing  Possibility of narcolepsy-symptoms are not consistent as well has on days when she gets a good 8 hours of sleep, she feels a lot better and is not as sleepy She may be a long sleeper      Plan:     We will keep a two-week sleep log  Use of caffeinated beverages to help her stay alert  Possibility of requiring an MSLT was discussed with the patient, need to ensure she does not have significant sleep disordered breathing with a history of snoring also discussed  I will see her back in the office in about 4 to 6 weeks  Encouraged to call with any significant concerns

## 2018-11-15 ENCOUNTER — Ambulatory Visit (INDEPENDENT_AMBULATORY_CARE_PROVIDER_SITE_OTHER): Payer: Medicare Other | Admitting: Pulmonary Disease

## 2018-11-15 ENCOUNTER — Other Ambulatory Visit: Payer: Self-pay

## 2018-11-15 ENCOUNTER — Encounter: Payer: Self-pay | Admitting: Pulmonary Disease

## 2018-11-15 VITALS — BP 116/60 | HR 74 | Temp 97.1°F | Ht 67.0 in | Wt 139.6 lb

## 2018-11-15 DIAGNOSIS — G4719 Other hypersomnia: Secondary | ICD-10-CM

## 2018-11-15 NOTE — Patient Instructions (Signed)
Daytime sleepiness  Continue with use of caffeinated beverages Caffeine pills may help  Call with any significant changes/concerns  I will see you back in 6 months

## 2018-11-15 NOTE — Progress Notes (Signed)
Subjective:     Patient ID: Alexandra Rodriguez, female   DOB: [birth year], 68 y.o.   MRN: [id number]  Patient with a history of excessive daytime sleepiness  Can fall asleep easily during the day  Since her last visit here she has increased caffeinated beverage intake This has  helped symptoms  History: She feels she is somebody that needs about 8 hours of sleep at night On days when she gets 8 hours of sleep-usually feels relatively well during the day Tries to go to bed at between 1 and 2 AM-she is a night owl Takes about 10 minutes to fall asleep Does not wake up during the night final awakening time about 8:52 AM  Denies any dryness of her mouth in the mornings She does snore  No witnessed apneas No memory issues  She states that most members of her family have difficulty going to bed early-all described as 9 hours Most of them can fall asleep very easily during the day  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 Bronchitis   . Bronchitis   . Pneumonia    Social History   Socioeconomic History  . Marital status: Married    Spouse name: Not on file  . Number of children: Not on file  . Years of education: Not on file  . Highest education level: Not on file  Occupational History  . Not on file  Social Needs  . Financial resource strain: Not on file  . Food insecurity    Worry: Not on file    Inability: Not on file  . Transportation needs    Medical: Not on file    Non-medical: Not on file  Tobacco Use  . Smoking status: Former Smoker    Packs/day: 0.75    Years: 10.00    Pack years: 7.50    Types: Cigarettes    Quit date: 02/03/1982    Years since  quitting: 36.8  . Smokeless tobacco: Never Used  Substance and Sexual Activity  . Alcohol use: Yes    Comment: 2-3 per wk  . Drug use: No  . Sexual activity: Not on file  Lifestyle  . Physical activity    Days per week: Not on file    Minutes per session: Not on file  . Stress: Not on file  Relationships  . Social Musicianconnections    Talks on phone: Not on file    Gets together: Not on file    Attends religious service: Not on file    Active member of club or organization: Not on file    Attends meetings of clubs or organizations: Not on file    Relationship status: Not on file  . Intimate partner violence    Fear of current or ex partner: Not on file    Emotionally abused: Not on file    Physically abused: Not on file    Forced sexual activity: Not on file  Other Topics Concern  . Not on file  Social History Narrative  . Not on file   Family History  Problem Relation Age of Onset  . Thyroid disease Mother   . Heart failure Father  Review of Systems  Constitutional: Negative for fever and unexpected weight change.  HENT: Negative for congestion, dental problem, ear pain, nosebleeds, postnasal drip, rhinorrhea, sinus pressure, sneezing, sore throat and trouble swallowing.   Eyes: Negative for redness and itching.  Respiratory: Negative for apnea, cough, chest tightness, shortness of breath and wheezing.   Cardiovascular: Negative for palpitations and leg swelling.  Gastrointestinal: Negative for nausea and vomiting.  Genitourinary: Negative for dysuria.  Musculoskeletal: Negative for joint swelling.  Skin: Negative for rash.  Allergic/Immunologic: Positive for environmental allergies. Negative for food allergies and immunocompromised state.  Neurological: Negative for headaches.  Hematological: Does not bruise/bleed easily.  Psychiatric/Behavioral: Negative for dysphoric mood and sleep disturbance. The patient is not nervous/anxious.        Objective:   Physical  Exam Constitutional:      Appearance: Normal appearance.  HENT:     Head: Normocephalic and atraumatic.     Mouth/Throat:     Mouth: Mucous membranes are moist.  Eyes:     General:        Right eye: No discharge.        Left eye: No discharge.  Neck:     Musculoskeletal: Normal range of motion and neck supple. No neck rigidity or muscular tenderness.  Cardiovascular:     Rate and Rhythm: Normal rate and regular rhythm.     Pulses: Normal pulses.     Heart sounds: Normal heart sounds. No murmur. No friction rub.  Pulmonary:     Effort: Pulmonary effort is normal. No respiratory distress.     Breath sounds: Normal breath sounds. No stridor. No wheezing or rhonchi.  Abdominal:     General: Abdomen is flat. There is no distension.  Musculoskeletal: Normal range of motion.  Skin:    General: Skin is warm and dry.  Neurological:     Mental Status: She is alert.    Results of the Epworth flowsheet 10/18/2018  Sitting and reading 3  Watching TV 3  Sitting, inactive in a public place (e.g. a theatre or a meeting) 3  As a passenger in a car for an hour without a break 2  Lying down to rest in the afternoon when circumstances permit 3  Sitting and talking to someone 0  Sitting quietly after a lunch without alcohol 2  In a car, while stopped for a few minutes in traffic 2  Total score 18   Vitals:   11/15/18 1207 11/15/18 1208  BP:  116/60  Pulse:  74  Temp: (!) 97.1 F (36.2 C)   SpO2:  98%   Sleep log revealed that she gets about 6 to 8 hours of sleep overnight Nightly alcohol use-she states that this helps tinnitus-about the only thing that helps her tinnitus    Assessment:     Severe daytime sleepiness -Symptoms seem a little bit better with caffeinated beverages  I do not think she has obstructive sleep apnea  Possibility of narcolepsy-symptoms are not consistent as well has on days when she gets a good 8 hours of sleep, she feels a lot better and is not as  sleepy She may be a long sleeper  She has a lot of stressors -Reluctant to be on any stimulant, does not believe she requires an antidepressant at present as well    Plan:     Continue to use caffeinated beverages to help her stay alert, caffeine pills may be helpful  Possibility of requiring an MSLT was discussed with  the patient, need to ensure she does not have significant sleep disordered breathing with a history of snoring also discussed  I will see her back in the office in about 6 months  Encouraged to call with any significant concerns

## 2019-02-25 ENCOUNTER — Ambulatory Visit: Payer: Medicare Other

## 2019-03-04 ENCOUNTER — Ambulatory Visit: Payer: Medicare Other

## 2019-03-12 ENCOUNTER — Ambulatory Visit: Payer: Medicare Other | Attending: Internal Medicine

## 2019-03-12 DIAGNOSIS — Z23 Encounter for immunization: Secondary | ICD-10-CM

## 2019-03-12 NOTE — Progress Notes (Signed)
   Covid-19 Vaccination Clinic  Name:  Cianna Kasparian    MRN: [id number] DOB: December 22, 1950  03/12/2019  Ms. Bruno was observed post Covid-19 immunization for 15 minutes without incidence. She was provided with Vaccine Information Sheet and instruction to access the V-Safe system.   Ms. Drum was instructed to call 911 with any severe reactions post vaccine: Marland Kitchen Difficulty breathing  . Swelling of your face and throat  . A fast heartbeat  . A bad rash all over your body  . Dizziness and weakness    Immunizations Administered    Name Date Dose VIS Date Route   Pfizer COVID-19 Vaccine 03/12/2019  3:21 PM 0.3 mL 01/14/2019 Intramuscular   Manufacturer: [employer], [employer]   Lot: [id number]   NDC: [address]-0

## 2019-04-06 ENCOUNTER — Ambulatory Visit: Payer: Medicare Other | Attending: Internal Medicine

## 2019-04-06 DIAGNOSIS — Z23 Encounter for immunization: Secondary | ICD-10-CM | POA: Insufficient documentation

## 2019-04-06 NOTE — Progress Notes (Signed)
   Covid-19 Vaccination Clinic  Name:  Alexandra Rodriguez    MRN: [id number] DOB: 1950-03-15  04/06/2019  [name] was observed post Covid-19 immunization for 15 minutes without incident. She was provided with Vaccine Information Sheet and instruction to access the V-Safe system.   [name] was instructed to call 911 with any severe reactions post vaccine: Marland Kitchen Difficulty breathing  . Swelling of face and throat  . A fast heartbeat  . A bad rash all over body  . Dizziness and weakness   Immunizations Administered    Name Date Dose VIS Date Route   Pfizer COVID-19 Vaccine 04/06/2019 11:02 AM 0.3 mL 01/14/2019 Intramuscular   Manufacturer: [employer], [employer]   Lot: [id number]   NDC: [address]-1

## 2019-09-27 ENCOUNTER — Other Ambulatory Visit: Payer: Self-pay | Admitting: Internal Medicine

## 2019-09-27 DIAGNOSIS — R5381 Other malaise: Secondary | ICD-10-CM

## 2020-04-13 ENCOUNTER — Other Ambulatory Visit: Payer: Self-pay | Admitting: Internal Medicine

## 2020-04-13 DIAGNOSIS — M8580 Other specified disorders of bone density and structure, unspecified site: Secondary | ICD-10-CM

## 2020-04-25 ENCOUNTER — Other Ambulatory Visit: Payer: Self-pay

## 2020-04-25 DIAGNOSIS — I83813 Varicose veins of bilateral lower extremities with pain: Secondary | ICD-10-CM

## 2020-04-27 ENCOUNTER — Other Ambulatory Visit: Payer: Self-pay

## 2020-04-27 ENCOUNTER — Ambulatory Visit (HOSPITAL_COMMUNITY)
Admission: RE | Admit: 2020-04-27 | Discharge: 2020-04-27 | Disposition: A | Discharge status: Home / Self Care | Payer: Medicare Other | Source: Ambulatory Visit | Attending: Vascular Surgery | Admitting: Vascular Surgery

## 2020-04-27 DIAGNOSIS — I83813 Varicose veins of bilateral lower extremities with pain: Secondary | ICD-10-CM | POA: Diagnosis not present

## 2020-04-30 ENCOUNTER — Other Ambulatory Visit: Payer: Self-pay

## 2020-04-30 ENCOUNTER — Ambulatory Visit (INDEPENDENT_AMBULATORY_CARE_PROVIDER_SITE_OTHER): Payer: Medicare Other | Admitting: Physician Assistant

## 2020-04-30 VITALS — BP 133/86 | HR 83 | Temp 98.5°F | Resp 20 | Ht 67.0 in | Wt 136.4 lb

## 2020-04-30 DIAGNOSIS — I781 Nevus, non-neoplastic: Secondary | ICD-10-CM

## 2020-04-30 DIAGNOSIS — I872 Venous insufficiency (chronic) (peripheral): Secondary | ICD-10-CM | POA: Diagnosis not present

## 2020-04-30 NOTE — Progress Notes (Signed)
Office Note     CC:  follow up Requesting Provider:  Melida Quitter, MD  HPI: Alexandra Rodriguez is a 70 y.o. (Jun 18, 1950) female who presents for evaluation of prominent veins in LLE.  She was recently seen by her Dermatologist who suggested referral to vascular surgery for evaluation.  She denies nay history of DVT, venous ulcerations, trauma, or prior vascular interventions.  She has cramping in LLE at night however believes sleeping with a bar of soap near her legs helps manage these symptoms.  She denies any severe edema of LLE.  She also denies claudication, rest pain, or non healing wounds of BLE.  She walks about 4-5 times a week for 4 miles at a time.  She occasionally wears compression stockings and they seem to make her legs feel better.  She denies tobacco use.   Past Medical History:  Diagnosis Date  . Bronchitis   . Bronchitis   . Pneumonia     Past Surgical History:  Procedure Laterality Date  . TUBAL LIGATION    . TUBAL LIGATION      Social History   Socioeconomic History  . Marital status: Married    Spouse name: Not on file  . Number of children: 2  . Years of education: Not on file  . Highest education level: Not on file  Occupational History  . Not on file  Tobacco Use  . Smoking status: Former Smoker    Packs/day: 0.75    Years: 10.00    Pack years: 7.50    Types: Cigarettes    Quit date: 02/03/1982    Years since quitting: 38.2  . Smokeless tobacco: Never Used  Substance and Sexual Activity  . Alcohol use: Yes    Comment: 2-3 per wk  . Drug use: No  . Sexual activity: Not on file  Other Topics Concern  . Not on file  Social History Narrative  . Not on file   Social Determinants of Health   Financial Resource Strain: Not on file  Food Insecurity: Not on file  Transportation Needs: Not on file  Physical Activity: Not on file  Stress: Not on file  Social Connections: Not on file  Intimate Partner Violence: Not on file    Family History   Problem Relation Age of Onset  . Thyroid disease Mother   . Heart failure Father     Current Outpatient Medications  Medication Sig Dispense Refill  . acetaminophen (TYLENOL) 650 MG CR tablet Take 650 mg by mouth every 8 (eight) hours as needed for pain.    . cetirizine (ZYRTEC) 10 MG tablet Take 5 mg by mouth daily.     . Cholecalciferol (VITAMIN D) 50 MCG (2000 UT) tablet Take 2,000 Units by mouth daily.     . hydrochlorothiazide (MICROZIDE) 12.5 MG capsule Take 12.5 mg by mouth daily.    . Multiple Vitamin (MULTIVITAMIN) capsule Take by mouth.     No current facility-administered medications for this visit.    No Known Allergies   REVIEW OF SYSTEMS:   [X]  denotes positive finding, [ ]  denotes negative finding Cardiac  Comments:  Chest pain or chest pressure:    Shortness of breath upon exertion:    Short of breath when lying flat:    Irregular heart rhythm:        Vascular    Pain in calf, thigh, or hip brought on by ambulation:    Pain in feet at night that wakes you up from  your sleep:     Blood clot in your veins:    Leg swelling:         Pulmonary    Oxygen at home:    Productive cough:     Wheezing:         Neurologic    Sudden weakness in arms or legs:     Sudden numbness in arms or legs:     Sudden onset of difficulty speaking or slurred speech:    Temporary loss of vision in one eye:     Problems with dizziness:         Gastrointestinal    Blood in stool:     Vomited blood:         Genitourinary    Burning when urinating:     Blood in urine:        Psychiatric    Major depression:         Hematologic    Bleeding problems:    Problems with blood clotting too easily:        Skin    Rashes or ulcers:        Constitutional    Fever or chills:      PHYSICAL EXAMINATION:  Vitals:   04/30/20 1108  BP: 133/86  Pulse: 83  Resp: 20  Temp: 98.5 F (36.9 C)  TempSrc: Temporal  SpO2: 99%  Weight: 136 lb 6.4 oz (61.9 kg)  Height: 5\' 7"   (1.702 m)    General:  WDWN in NAD; vital signs documented above Gait: Not observed HENT: WNL, normocephalic Pulmonary: normal non-labored breathing  Cardiac: regular HR Abdomen: soft, NT, no masses Skin: without rashes Vascular Exam/Pulses:  Right Left  Radial 2+ (normal) 2+ (normal)  DP 2+ (normal) 2+ (normal)   Extremities: without ischemic changes, without Gangrene , without cellulitis; without open wounds;  Musculoskeletal: no muscle wasting or atrophy; scattered spider veins of L anterior lower leg  Neurologic: A&O X 3;  No focal weakness or paresthesias are detected Psychiatric:  The pt has Normal affect.   Non-Invasive Vascular Imaging:   LLE reflux study negative for DVT Negative for superficial reflux L CFV reflux noted    ASSESSMENT/PLAN:: 70 y.o. female here for evaluation of spider veins of left lower leg  -Left leg venous reflux study negative for DVT and superficial venous reflux; reflux noted only in L common femoral vein -Recommended knee high 15-57mmHg compression to be worn regularly -Also recommended periodic elevation of legs during the day and to avoid prolonged sitting and standing -Discussed sclerotherapy however patient is currently not interested -We can repeat reflux study in the future if she develops edema or worsening venous symptoms however for now, patient will follow up as needed   30m, PA-C [facility] [phone]  Clinic MD:   [phone]

## 2020-05-03 ENCOUNTER — Other Ambulatory Visit: Payer: Self-pay

## 2020-05-03 ENCOUNTER — Ambulatory Visit
Admission: RE | Admit: 2020-05-03 | Discharge: 2020-05-03 | Disposition: A | Discharge status: Home / Self Care | Payer: Medicare Other | Source: Ambulatory Visit | Attending: Internal Medicine | Admitting: Internal Medicine

## 2020-05-03 DIAGNOSIS — M8580 Other specified disorders of bone density and structure, unspecified site: Secondary | ICD-10-CM

## 2020-07-16 ENCOUNTER — Telehealth: Payer: Self-pay

## 2020-07-16 NOTE — Telephone Encounter (Signed)
Patient was seen at the end of March 2022 for spider veins. She left a VM today about some swelling that goes down when she raises her leg. She would like an appointment. Left VM advising to elevate the leg, wear compression socks and avoid prolonged sitting and standing. Advised to call back if she wanted to discuss sclerotherapy or had further questions/concerns.

## 2020-07-25 ENCOUNTER — Other Ambulatory Visit (HOSPITAL_COMMUNITY): Payer: Self-pay | Admitting: *Deleted

## 2020-07-26 ENCOUNTER — Ambulatory Visit (HOSPITAL_COMMUNITY)
Admission: RE | Admit: 2020-07-26 | Discharge: 2020-07-26 | Disposition: A | Discharge status: Home / Self Care | Payer: Medicare Other | Source: Ambulatory Visit | Attending: Internal Medicine | Admitting: Internal Medicine

## 2020-07-26 ENCOUNTER — Other Ambulatory Visit: Payer: Self-pay

## 2020-07-26 DIAGNOSIS — M810 Age-related osteoporosis without current pathological fracture: Secondary | ICD-10-CM | POA: Diagnosis not present

## 2020-07-26 MED ORDER — DENOSUMAB 60 MG/ML ~~LOC~~ SOSY: 60.0000 mg | PREFILLED_SYRINGE | Freq: Once | SUBCUTANEOUS | Status: AC

## 2020-07-26 MED ORDER — DENOSUMAB 60 MG/ML ~~LOC~~ SOSY
PREFILLED_SYRINGE | SUBCUTANEOUS | Status: AC
  Administered 2020-07-26: 60 mg via SUBCUTANEOUS
  Filled 2020-07-26: qty 1

## 2020-10-16 ENCOUNTER — Other Ambulatory Visit: Payer: Self-pay

## 2020-10-16 DIAGNOSIS — I872 Venous insufficiency (chronic) (peripheral): Secondary | ICD-10-CM

## 2020-10-26 ENCOUNTER — Ambulatory Visit (INDEPENDENT_AMBULATORY_CARE_PROVIDER_SITE_OTHER): Payer: Medicare Other | Admitting: Physician Assistant

## 2020-10-26 ENCOUNTER — Other Ambulatory Visit: Payer: Self-pay

## 2020-10-26 ENCOUNTER — Ambulatory Visit (HOSPITAL_COMMUNITY)
Admission: RE | Admit: 2020-10-26 | Discharge: 2020-10-26 | Disposition: A | Discharge status: Home / Self Care | Payer: Medicare Other | Source: Ambulatory Visit | Attending: Vascular Surgery | Admitting: Vascular Surgery

## 2020-10-26 VITALS — BP 135/87 | HR 76 | Temp 98.1°F | Resp 14 | Ht 60.5 in | Wt 133.0 lb

## 2020-10-26 DIAGNOSIS — I872 Venous insufficiency (chronic) (peripheral): Secondary | ICD-10-CM

## 2020-10-26 DIAGNOSIS — I781 Nevus, non-neoplastic: Secondary | ICD-10-CM | POA: Diagnosis not present

## 2020-10-26 NOTE — Progress Notes (Signed)
[facility]           [facility] [place]  History and Physical   Alexandra Rodriguez is a 70 y.o. female who was seen in our office in March 2022 for evaluation of prominent veins in the LLE.  She did not have any hx of DVT, venous ulcerations, trauma or prior vascular interventions.  She did have cramping in the LLE at night but sleeping with a bar of soap near her legs helped manage her symptoms.  She did not have any claudication, rest pain or non healing wounds.  She was walking 4-5 times per week for about 4 miles at a time.  She occasionally wore compression.  Her venous duplex was negative for DVT or superficial venous reflux and only had reflux at the left CFV.   Pt was not interested in sclerotherapy at that time.    She returns today with complaints of an area on her lower leg the size of a 50 cents piece.  She states that she goes for lymphatics treatment and this goes away with that.  She also states this area gets better with elevation.  She continues to walk 4-5x/week ~ 4 miles.  She states that her daughter was diagnosed with prothrombin gene mutation II as she was having mini strokes.  Pt went and got tested as well and she also tested positive for this.  She states she wears her compression when she travels and when she walks in the winter.  She does have some mild swelling.  She does get some cramping in her legs at night.   She is now interested in sclerotherapy for the spider veins on her lower leg.    The pt is not on a statin for cholesterol management.  The pt is not on a daily aspirin.   Other AC:  none The pt is on HCTZ for hypertension.   The pt is not diabetic.   Tobacco hx:  former  Pt does not have family hx of AAA.  Past Medical History:  Diagnosis Date   Bronchitis    Bronchitis    Pneumonia     Past Surgical History:  Procedure Laterality Date   TUBAL LIGATION     TUBAL LIGATION      Social History   Socioeconomic History    Marital status: Married    Spouse name: Not on file   Number of children: 2   Years of education: Not on file   Highest education level: Not on file  Occupational History   Not on file  Tobacco Use   Smoking status: Former    Packs/day: 0.75    Years: 10.00    Pack years: 7.50    Types: Cigarettes    Quit date: 02/03/1982    Years since quitting: 38.7   Smokeless tobacco: Never  Substance and Sexual Activity   Alcohol use: Yes    Comment: 2-3 per wk   Drug use: No   Sexual activity: Not on file  Other Topics Concern   Not on file  Social History Narrative   Not on file   Social Determinants of Health   Financial Resource Strain: Not on file  Food Insecurity: Not on file  Transportation Needs: Not on file  Physical Activity: Not on file  Stress: Not on file  Social Connections: Not on file  Intimate Partner Violence: Not on file     Family History  Problem Relation Age of Onset   Thyroid disease Mother    Heart failure Father     Current Outpatient Medications  Medication Sig Dispense Refill   acetaminophen (TYLENOL) 650 MG CR tablet Take 650 mg by mouth every 8 (eight) hours as needed for pain.     cetirizine (ZYRTEC) 10 MG tablet Take 5 mg by mouth daily.      Cholecalciferol (VITAMIN D) 50 MCG (2000 UT) tablet Take 2,000 Units by mouth daily.      hydrochlorothiazide (MICROZIDE) 12.5 MG capsule Take 12.5 mg by mouth daily.     Multiple Vitamin (MULTIVITAMIN) capsule Take by mouth.     No current facility-administered medications for this visit.    No Known Allergies  REVIEW OF SYSTEMS:   [X]  denotes positive finding, [ ]  denotes negative finding Cardiac  Comments:  Chest pain or chest pressure:    Shortness of breath upon exertion:    Short of breath when lying flat:    Irregular heart rhythm:        Vascular    Pain in calf, thigh, or hip brought on by ambulation: x   Pain in feet at night that wakes you up from your sleep:     Blood clot in your  veins:    Leg swelling:         Pulmonary    Oxygen at home:    Productive cough:     Wheezing:         Neurologic    Sudden weakness in arms or legs:     Sudden numbness in arms or legs:     Sudden onset of difficulty speaking or slurred speech:    Temporary loss of vision in one eye:     Problems with dizziness:         Gastrointestinal    Blood in stool:     Vomited blood:         Genitourinary    Burning when urinating:     Blood in urine:        Psychiatric    Major depression:         Hematologic    Bleeding problems:    Problems with blood clotting too easily:        Skin    Rashes or ulcers:        Constitutional    Fever or chills:      PHYSICAL EXAMINATION:  Today's Vitals   10/26/20 1435  BP: 135/87  Pulse: 76  Resp: 14  Temp: 98.1 F (36.7 C)  TempSrc: Temporal  SpO2: 99%  Weight: 133 lb (60.3 kg)  Height: 5' 0.5" (1.537 m)  PainSc: 4    Body mass index is 25.55 kg/m.   General:  WDWN in NAD; vital signs documented above Gait: Not observed HENT: WNL, normocephalic Pulmonary: normal non-labored breathing without wheezing Cardiac: regular HR; without carotid bruits Abdomen: soft, NT, no masses; aortic pulse is not palpable Skin: without rashes Vascular Exam/Pulses:  Right Left  Radial 2+ (normal) 2+ (normal)  DP 2+ (normal) 2+ (normal)  PT 2+ (normal) 2+ (normal)   Extremities:       Neurologic: A&O X 3;  moving all extremities equally Psychiatric:  The pt has Normal affect.   Non-Invasive Vascular Imaging:   Venous duplex on 10/26/2020: +--------------+---------+------+-----------+------------+--------+  LEFT          Reflux NoRefluxReflux TimeDiameter cmsComments  Yes                                   +--------------+---------+------+-----------+------------+--------+  CFV                     yes   >1 second                        +--------------+---------+------+-----------+------------+--------+  FV mid        no                                              +--------------+---------+------+-----------+------------+--------+  Popliteal     no                                              +--------------+---------+------+-----------+------------+--------+  GSV at SFJ    no                           0.702              +--------------+---------+------+-----------+------------+--------+  GSV prox thighno                           0.306              +--------------+---------+------+-----------+------------+--------+  GSV mid thigh no                            0.36              +--------------+---------+------+-----------+------------+--------+  GSV dist thighno                           0.384              +--------------+---------+------+-----------+------------+--------+  GSV at knee   no                           0.288              +--------------+---------+------+-----------+------------+--------+  GSV prox calf           yes    >500 ms     0.328              +--------------+---------+------+-----------+------------+--------+  SSV Pop Fossa no                           0.536              +--------------+---------+------+-----------+------------+--------+  SSV prox calf no                           0.212              +--------------+---------+------+-----------+------------+--------+  SSV mid calf  no                           0.265              +--------------+---------+------+-----------+------------+--------+  Summary:  Left:  - No evidence of deep vein thrombosis seen in the left lower extremity,  from the common femoral through the popliteal veins.  - No evidence of superficial venous thrombosis in the left lower  extremity.  - Deep vein reflux in the CFV.  - Superficial vein reflux in the GSV in the proximal calf.   Kynzie Polgar is a 70 y.o. female who presents with: a small area of swelling in the lower left leg   -pt has easily palpable pedal pulses bilaterally -pt does not have evidence of DVT.  Pt does have venous reflux in the left CFV as well as the GSV in the proximal calf.  Otherwise, there is no other reflux -she was seen here in March and since then, she was diagnosed with prothrombin gene mutation II.  I discussed with Dr. Karin Lieu about sclerotherapy for this pt.  There is a relative contraindication in pts with a diagnosis of thrombophilia and therefore would not recommend sclerotherapy in this pt. -discussed with pt about wearing knee high 15-20 mmHg compression stockings  -discussed the importance of leg elevation and how to elevate properly - pt is advised to elevate their legs and a diagram is given to them to demonstrate to lay flat on their back with knees elevated and slightly bent with their feet higher than her knees, which puts their feet higher than their heart for 15 minutes per day.  If they cannot lay flat, advised to lay as flat as possible.  -pt is advised to continue as much walking as possible and avoid sitting or standing for long periods of time.  -discussed importance of weight loss and exercise and that water aerobics would also be beneficial.  -handout with recommendations given -ok for pt to continue her lymphatics treatments -pt will f/u as needed   Doreatha Massed, [facility] 10/26/2020 2:18 PM  Clinic MD:  Karin Lieu

## 2021-01-24 ENCOUNTER — Other Ambulatory Visit (HOSPITAL_COMMUNITY): Payer: Self-pay | Admitting: *Deleted

## 2021-01-25 ENCOUNTER — Other Ambulatory Visit: Payer: Self-pay

## 2021-01-25 ENCOUNTER — Ambulatory Visit (HOSPITAL_COMMUNITY)
Admission: RE | Admit: 2021-01-25 | Discharge: 2021-01-25 | Disposition: A | Discharge status: Home / Self Care | Payer: Medicare Other | Source: Ambulatory Visit | Attending: Internal Medicine | Admitting: Internal Medicine

## 2021-01-25 DIAGNOSIS — M810 Age-related osteoporosis without current pathological fracture: Secondary | ICD-10-CM | POA: Diagnosis not present

## 2021-01-25 MED ORDER — DENOSUMAB 60 MG/ML ~~LOC~~ SOSY
60.0000 mg | PREFILLED_SYRINGE | Freq: Once | SUBCUTANEOUS | Status: AC
  Administered 2021-01-25: 13:00:00 60 mg via SUBCUTANEOUS

## 2021-01-25 MED ORDER — DENOSUMAB 60 MG/ML ~~LOC~~ SOSY
PREFILLED_SYRINGE | SUBCUTANEOUS | Status: AC
  Filled 2021-01-25: qty 1

## 2021-08-06 ENCOUNTER — Emergency Department (HOSPITAL_COMMUNITY)
Admission: EM | Admit: 2021-08-06 | Discharge: 2021-08-06 | Disposition: A | Discharge status: Home / Self Care | Payer: Medicare Other | Attending: Emergency Medicine | Admitting: Emergency Medicine

## 2021-08-06 ENCOUNTER — Encounter (HOSPITAL_COMMUNITY): Payer: Self-pay | Admitting: Emergency Medicine

## 2021-08-06 ENCOUNTER — Other Ambulatory Visit: Payer: Self-pay

## 2021-08-06 ENCOUNTER — Emergency Department (HOSPITAL_COMMUNITY): Payer: Medicare Other

## 2021-08-06 DIAGNOSIS — J189 Pneumonia, unspecified organism: Secondary | ICD-10-CM | POA: Insufficient documentation

## 2021-08-06 DIAGNOSIS — R509 Fever, unspecified: Secondary | ICD-10-CM | POA: Diagnosis present

## 2021-08-06 LAB — CBC WITH DIFFERENTIAL/PLATELET
Abs Immature Granulocytes: 0.06 10*3/uL (ref 0.00–0.07)
Basophils Absolute: 0 10*3/uL (ref 0.0–0.1)
Basophils Relative: 0 %
Eosinophils Absolute: 0 10*3/uL (ref 0.0–0.5)
Eosinophils Relative: 0 %
HCT: 33.7 % — ABNORMAL LOW (ref 36.0–46.0)
Hemoglobin: 10.9 g/dL — ABNORMAL LOW (ref 12.0–15.0)
Immature Granulocytes: 1 %
Lymphocytes Relative: 9 %
Lymphs Abs: 1 10*3/uL (ref 0.7–4.0)
MCH: 27.1 pg (ref 26.0–34.0)
MCHC: 32.3 g/dL (ref 30.0–36.0)
MCV: 83.8 fL (ref 80.0–100.0)
Monocytes Absolute: 1.6 10*3/uL — ABNORMAL HIGH (ref 0.1–1.0)
Monocytes Relative: 15 %
Neutro Abs: 8.1 10*3/uL — ABNORMAL HIGH (ref 1.7–7.7)
Neutrophils Relative %: 75 %
Platelets: 201 10*3/uL (ref 150–400)
RBC: 4.02 MIL/uL (ref 3.87–5.11)
RDW: 13.8 % (ref 11.5–15.5)
WBC: 10.7 10*3/uL — ABNORMAL HIGH (ref 4.0–10.5)
nRBC: 0 % (ref 0.0–0.2)

## 2021-08-06 LAB — URINALYSIS, ROUTINE W REFLEX MICROSCOPIC
Bilirubin Urine: NEGATIVE
Glucose, UA: NEGATIVE mg/dL
Hgb urine dipstick: NEGATIVE
Ketones, ur: NEGATIVE mg/dL
Leukocytes,Ua: NEGATIVE
Nitrite: NEGATIVE
Protein, ur: NEGATIVE mg/dL
Specific Gravity, Urine: 1.006 (ref 1.005–1.030)
pH: 6 (ref 5.0–8.0)

## 2021-08-06 LAB — BASIC METABOLIC PANEL
Anion gap: 7 (ref 5–15)
BUN: 11 mg/dL (ref 8–23)
CO2: 24 mmol/L (ref 22–32)
Calcium: 7.7 mg/dL — ABNORMAL LOW (ref 8.9–10.3)
Chloride: 106 mmol/L (ref 98–111)
Creatinine, Ser: 0.63 mg/dL (ref 0.44–1.00)
GFR, Estimated: >60 mL/min (ref >60)
Glucose, Bld: 159 mg/dL — ABNORMAL HIGH (ref 70–99)
Potassium: 2.8 mmol/L — ABNORMAL LOW (ref 3.5–5.1)
Sodium: 137 mmol/L (ref 135–145)

## 2021-08-06 LAB — TROPONIN I (HIGH SENSITIVITY)
Troponin I (High Sensitivity): 10 ng/L (ref <18)
Troponin I (High Sensitivity): 7 ng/L (ref <18)

## 2021-08-06 MED ORDER — POTASSIUM CHLORIDE CRYS ER 20 MEQ PO TBCR
40.0000 meq | EXTENDED_RELEASE_TABLET | Freq: Once | ORAL | Status: AC
  Administered 2021-08-06: 40 meq via ORAL
  Filled 2021-08-06: qty 2

## 2021-08-06 MED ORDER — IBUPROFEN 200 MG PO TABS
600.0000 mg | ORAL_TABLET | Freq: Once | ORAL | Status: AC
  Administered 2021-08-06: 600 mg via ORAL
  Filled 2021-08-06: qty 3

## 2021-08-06 MED ORDER — SODIUM CHLORIDE 0.9 % IV SOLN
500.0000 mg | Freq: Once | INTRAVENOUS | Status: AC
  Administered 2021-08-06: 500 mg via INTRAVENOUS
  Filled 2021-08-06: qty 5

## 2021-08-06 MED ORDER — SODIUM CHLORIDE 0.9 % IV SOLN
1.0000 g | Freq: Once | INTRAVENOUS | Status: AC
  Administered 2021-08-06: 1 g via INTRAVENOUS
  Filled 2021-08-06: qty 10

## 2021-08-06 MED ORDER — AZITHROMYCIN 250 MG PO TABS: ORAL_TABLET | ORAL | 0 refills | Status: DC

## 2021-08-06 NOTE — ED Provider Notes (Signed)
[facility]-EMERGENCY DEPT Provider Note   CSN: [id number] Arrival date & time: 08/06/21  4128     History  Chief Complaint  Patient presents with   Fever   Chills    Alexandra Rodriguez is a 71 y.o. female.  Patient presents with chief complaint of fever chills left-sided chest pain ongoing for about 2 days.  Denies any vomiting or diarrhea.  States she only has a "mild cough."  Denies any shortness of breath or palpitations.       Home Medications Prior to Admission medications   Medication Sig Start Date End Date Taking? Authorizing Provider  azithromycin (ZITHROMAX) 250 MG tablet Take 1 tablet/day for 4 days.  Start first tablet tomorrow on 08/07/2021. 08/06/21  Yes Jeny Nield, Eustace Moore, MD  acetaminophen (TYLENOL) 650 MG CR tablet Take 650 mg by mouth every 8 (eight) hours as needed for pain.    [provider]  cetirizine (ZYRTEC) 10 MG tablet Take 5 mg by mouth daily.     [provider]  Cholecalciferol (VITAMIN D) 50 MCG (2000 UT) tablet Take 2,000 Units by mouth daily.     [provider]  hydrochlorothiazide (MICROZIDE) 12.5 MG capsule Take 12.5 mg by mouth daily. 04/29/20   [provider]  Multiple Vitamin (MULTIVITAMIN) capsule Take by mouth.    [provider]      Allergies    Patient has no known allergies.    Review of Systems   Review of Systems  Constitutional:  Positive for fever.  HENT:  Negative for ear pain.   Eyes:  Negative for pain.  Respiratory:  Positive for cough.   Cardiovascular:  Positive for chest pain.  Gastrointestinal:  Negative for abdominal pain.  Genitourinary:  Negative for flank pain.  Musculoskeletal:  Negative for back pain.  Skin:  Negative for rash.  Neurological:  Negative for headaches.    Physical Exam Updated Vital Signs BP 108/72   Pulse 76   Temp 100 F (37.8 C) (Oral)   Resp 18   Ht 5\' 7"  (1.702 m)   Wt 61.2 kg   SpO2 100%   BMI 21.14 kg/m  Physical  Exam Constitutional:      General: She is not in acute distress.    Appearance: Normal appearance.  HENT:     Head: Normocephalic.     Nose: Nose normal.  Eyes:     Extraocular Movements: Extraocular movements intact.  Cardiovascular:     Rate and Rhythm: Normal rate.  Pulmonary:     Effort: Pulmonary effort is normal.     Breath sounds: No wheezing, rhonchi or rales.  Abdominal:     Tenderness: There is no abdominal tenderness. There is no guarding or rebound.  Musculoskeletal:        General: Normal range of motion.     Cervical back: Normal range of motion.  Neurological:     General: No focal deficit present.     Mental Status: She is alert. Mental status is at baseline.     ED Results / Procedures / Treatments   Labs (all labs ordered are listed, but only abnormal results are displayed) Labs Reviewed  CBC WITH DIFFERENTIAL/PLATELET - Abnormal; Notable for the following components:      Result Value   WBC 10.7 (*)    Hemoglobin 10.9 (*)    HCT 33.7 (*)    Neutro Abs 8.1 (*)    Monocytes Absolute 1.6 (*)    All  other components within normal limits  BASIC METABOLIC PANEL - Abnormal; Notable for the following components:   Potassium 2.8 (*)    Glucose, Bld 159 (*)    Calcium 7.7 (*)    All other components within normal limits  CULTURE, BLOOD (ROUTINE X 2)  CULTURE, BLOOD (ROUTINE X 2)  URINALYSIS, ROUTINE W REFLEX MICROSCOPIC  TROPONIN I (HIGH SENSITIVITY)  TROPONIN I (HIGH SENSITIVITY)    EKG EKG Interpretation  Date/Time:  Tuesday August 06 2021 07:56:05 EDT Ventricular Rate:  94 PR Interval:  193 QRS Duration: 95 QT Interval:  354 QTC Calculation: 443 R Axis:   77 Text Interpretation: Sinus rhythm Confirmed by Norman Clay ([id number]) on 08/06/2021 12:31:11 PM  Radiology DG Chest Port 1 View  Result Date: 08/06/2021 CLINICAL DATA:  Chest pain, fever and chills for 2 days. EXAM: PORTABLE CHEST 1 VIEW COMPARISON:  Chest x-ray dated 04/08/2015 FINDINGS: Heart  size and mediastinal contours are stable. Lungs are hyperexpanded. New ill-defined airspace opacity at the LEFT lung base. No pleural effusion or pneumothorax is seen. Osseous structures about the chest are unremarkable. IMPRESSION: 1. New ill-defined airspace opacity at the LEFT lung base, suspicious for pneumonia. 2. Hyperexpanded lungs indicating COPD. 3. No evidence of pulmonary edema. No pleural effusion or pneumothorax is seen. Electronically Signed   By: Bary Richard M.D.   On: 08/06/2021 08:51    Procedures Procedures    Medications Ordered in ED Medications  ibuprofen (ADVIL) tablet 600 mg (600 mg Oral Given 08/06/21 0801)  potassium chloride SA (KLOR-CON M) CR tablet 40 mEq (40 mEq Oral Given 08/06/21 1125)  cefTRIAXone (ROCEPHIN) 1 g in sodium chloride 0.9 % 100 mL IVPB (0 g Intravenous Stopped 08/06/21 1236)  azithromycin (ZITHROMAX) 500 mg in sodium chloride 0.9 % 250 mL IVPB (0 mg Intravenous Stopped 08/06/21 1331)    ED Course/ Medical Decision Making/ A&P                           Medical Decision Making Amount and/or Complexity of Data Reviewed Labs: ordered. Radiology: ordered.  Risk OTC drugs. Prescription drug management.   Review of records shows office visit May 11, 2021 for upper respite infection.  Cardiac monitoring showing sinus rhythm.  Diagnostic studies sent.  Troponins are flat x2.  Chemistry shows potassium 3.8 repleted with 40 mEq of potassium here in the ER.  Hemoglobin 10.9.  Chest x-ray concerning for left-sided pneumonia.  I feel this is consistent with cause of the patient's symptoms.  Given Rocephin and azithromycin.  Will be discharged home in stable condition, advised outpatient follow-up with her doctor within the week, advising immediate return for worsening symptoms difficulty breathing or any additional concerns.        Final Clinical Impression(s) / ED Diagnoses Final diagnoses:  Community acquired pneumonia of left lung, unspecified  part of lung    Rx / DC Orders ED Discharge Orders          Ordered    azithromycin (ZITHROMAX) 250 MG tablet        08/06/21 1414              Cheryll Cockayne, MD 08/06/21 1414

## 2021-08-06 NOTE — ED Notes (Signed)
Patient has a urine culture in the main lab 

## 2021-08-06 NOTE — Discharge Instructions (Addendum)
Call your primary care doctor or specialist as discussed in the next 2-3 days.   Return immediately back to the ER if:  Your symptoms worsen within the next 12-24 hours. You develop new symptoms such as new fevers, persistent vomiting, new pain, shortness of breath, or new weakness or numbness, or if you have any other concerns.  

## 2021-08-06 NOTE — ED Triage Notes (Signed)
Pt BIB EMS from home, c/o fever and chills x 2 days. Increased urinary output. Also persistent left sided chest pain. 650 mg Tylenol with 450 mL saline in 18 gauge LFA  BP 140/70 P 104 RR 35 CBG 120

## 2021-08-09 LAB — CULTURE, BLOOD (ROUTINE X 2)

## 2021-08-10 LAB — CULTURE, BLOOD (ROUTINE X 2)
Culture: NO GROWTH
Culture: NO GROWTH

## 2021-08-11 LAB — CULTURE, BLOOD (ROUTINE X 2)

## 2021-08-14 ENCOUNTER — Encounter (HOSPITAL_COMMUNITY): Payer: Medicare Other

## 2021-08-15 ENCOUNTER — Other Ambulatory Visit (HOSPITAL_COMMUNITY): Payer: Self-pay | Admitting: *Deleted

## 2021-08-16 ENCOUNTER — Encounter (HOSPITAL_COMMUNITY)
Admission: RE | Admit: 2021-08-16 | Discharge: 2021-08-16 | Disposition: A | Discharge status: Home / Self Care | Payer: Medicare Other | Source: Ambulatory Visit | Attending: Internal Medicine | Admitting: Internal Medicine

## 2021-08-16 DIAGNOSIS — M810 Age-related osteoporosis without current pathological fracture: Secondary | ICD-10-CM | POA: Diagnosis present

## 2021-08-16 MED ORDER — DENOSUMAB 60 MG/ML ~~LOC~~ SOSY
60.0000 mg | PREFILLED_SYRINGE | Freq: Once | SUBCUTANEOUS | Status: AC
  Administered 2021-08-16: 60 mg via SUBCUTANEOUS

## 2021-08-16 MED ORDER — DENOSUMAB 60 MG/ML ~~LOC~~ SOSY
PREFILLED_SYRINGE | SUBCUTANEOUS | Status: AC
  Filled 2021-08-16: qty 1

## 2021-08-19 ENCOUNTER — Ambulatory Visit
Admission: RE | Admit: 2021-08-19 | Discharge: 2021-08-19 | Disposition: A | Discharge status: Home / Self Care | Payer: Medicare Other | Source: Ambulatory Visit | Attending: Internal Medicine | Admitting: Internal Medicine

## 2021-08-19 ENCOUNTER — Other Ambulatory Visit: Payer: Self-pay | Admitting: Internal Medicine

## 2021-08-19 DIAGNOSIS — J690 Pneumonitis due to inhalation of food and vomit: Secondary | ICD-10-CM

## 2021-08-19 MED ORDER — IOPAMIDOL (ISOVUE-300) INJECTION 61%
75.0000 mL | Freq: Once | INTRAVENOUS | Status: AC | PRN
  Administered 2021-08-19: 75 mL via INTRAVENOUS

## 2021-11-08 ENCOUNTER — Other Ambulatory Visit: Payer: Self-pay | Admitting: Internal Medicine

## 2021-11-08 DIAGNOSIS — R9389 Abnormal findings on diagnostic imaging of other specified body structures: Secondary | ICD-10-CM

## 2021-12-06 ENCOUNTER — Ambulatory Visit
Admission: RE | Admit: 2021-12-06 | Discharge: 2021-12-06 | Disposition: A | Discharge status: Home / Self Care | Payer: Medicare Other | Source: Ambulatory Visit | Attending: Internal Medicine | Admitting: Internal Medicine

## 2021-12-06 DIAGNOSIS — R9389 Abnormal findings on diagnostic imaging of other specified body structures: Secondary | ICD-10-CM

## 2022-01-10 ENCOUNTER — Other Ambulatory Visit: Payer: Self-pay | Admitting: Internal Medicine

## 2022-01-10 DIAGNOSIS — M810 Age-related osteoporosis without current pathological fracture: Secondary | ICD-10-CM

## 2022-02-14 ENCOUNTER — Other Ambulatory Visit (HOSPITAL_COMMUNITY): Payer: Self-pay | Admitting: *Deleted

## 2022-02-17 ENCOUNTER — Ambulatory Visit (HOSPITAL_COMMUNITY)
Admission: RE | Admit: 2022-02-17 | Discharge: 2022-02-17 | Disposition: A | Discharge status: Home / Self Care | Payer: Medicare Other | Source: Ambulatory Visit | Attending: Internal Medicine | Admitting: Internal Medicine

## 2022-02-17 DIAGNOSIS — M810 Age-related osteoporosis without current pathological fracture: Secondary | ICD-10-CM | POA: Insufficient documentation

## 2022-02-17 MED ORDER — DENOSUMAB 60 MG/ML ~~LOC~~ SOSY
PREFILLED_SYRINGE | SUBCUTANEOUS | Status: AC
  Filled 2022-02-17: qty 1

## 2022-02-17 MED ORDER — DENOSUMAB 60 MG/ML ~~LOC~~ SOSY
60.0000 mg | PREFILLED_SYRINGE | Freq: Once | SUBCUTANEOUS | Status: AC
  Administered 2022-02-17: 60 mg via SUBCUTANEOUS

## 2022-03-19 ENCOUNTER — Other Ambulatory Visit: Payer: Self-pay | Admitting: Internal Medicine

## 2022-03-19 DIAGNOSIS — I10 Essential (primary) hypertension: Secondary | ICD-10-CM

## 2022-04-17 ENCOUNTER — Ambulatory Visit
Admission: RE | Admit: 2022-04-17 | Discharge: 2022-04-17 | Disposition: A | Discharge status: Home / Self Care | Payer: Self-pay | Source: Ambulatory Visit | Attending: Internal Medicine | Admitting: Internal Medicine

## 2022-04-17 DIAGNOSIS — I10 Essential (primary) hypertension: Secondary | ICD-10-CM

## 2022-07-10 ENCOUNTER — Ambulatory Visit
Admission: RE | Admit: 2022-07-10 | Discharge: 2022-07-10 | Disposition: A | Discharge status: Home / Self Care | Payer: Self-pay | Source: Ambulatory Visit | Attending: Internal Medicine | Admitting: Internal Medicine

## 2022-07-10 DIAGNOSIS — M810 Age-related osteoporosis without current pathological fracture: Secondary | ICD-10-CM

## 2022-07-23 ENCOUNTER — Other Ambulatory Visit: Payer: Self-pay

## 2022-07-23 DIAGNOSIS — M818 Other osteoporosis without current pathological fracture: Secondary | ICD-10-CM

## 2022-07-23 DIAGNOSIS — M810 Age-related osteoporosis without current pathological fracture: Secondary | ICD-10-CM | POA: Insufficient documentation

## 2022-07-28 ENCOUNTER — Telehealth: Payer: Self-pay

## 2022-07-28 NOTE — Telephone Encounter (Signed)
Auth Submission: NO AUTH NEEDED Site of care: Site of care: [facility] Payer: Medicare A/B & BCBS Supplement Medication & CPT/J Code(s) submitted: Prolia (Denosumab) [id number]  Auth type: Buy/Bill  Approval from: 07/28/2022 to 02/03/2023

## 2022-08-12 ENCOUNTER — Encounter: Payer: Self-pay | Admitting: Internal Medicine

## 2022-08-19 ENCOUNTER — Ambulatory Visit: Payer: Medicare Other

## 2022-08-19 ENCOUNTER — Encounter: Payer: Self-pay | Admitting: Internal Medicine

## 2022-08-19 VITALS — BP 110/68 | HR 78 | Temp 98.4°F | Resp 20 | Wt 134.8 lb

## 2022-08-19 DIAGNOSIS — M818 Other osteoporosis without current pathological fracture: Secondary | ICD-10-CM

## 2022-08-19 MED ORDER — DENOSUMAB 60 MG/ML ~~LOC~~ SOSY
60.0000 mg | PREFILLED_SYRINGE | Freq: Once | SUBCUTANEOUS | Status: AC
  Administered 2022-08-19: 60 mg via SUBCUTANEOUS

## 2022-08-19 NOTE — Progress Notes (Signed)
Diagnosis: Osteoporosis  Provider:  Chilton Greathouse MD  Procedure: Injection  Prolia (Denosumab), Dose: 60 mg, Site: subcutaneous, Number of injections: 1  Administered in left arm.  Post Care: Patient declined observation  Discharge: Condition: Good, Destination: Home . AVS Provided  Performed by:  Wyvonne Lenz, RN

## 2022-08-22 ENCOUNTER — Telehealth: Payer: Self-pay | Admitting: Pharmacy Technician

## 2022-08-22 NOTE — Telephone Encounter (Signed)
Auth Submission: NO AUTH NEEDED Site of care: Site of care: [facility] Payer: BCBS [place] Medication & CPT/J Code(s) submitted: Prolia (Denosumab) [id number] Route of submission (phone, fax, portal):  Phone #[phone] Fax # Auth type: Buy/Bill Units/visits requested: X2 Reference number: [id number] Approval from: 08/22/22 to 08/22/23

## 2023-02-20 ENCOUNTER — Ambulatory Visit: Payer: Medicare Other | Admitting: *Deleted

## 2023-02-20 VITALS — BP 129/83 | HR 84 | Temp 97.7°F | Resp 16 | Ht 66.0 in | Wt 136.6 lb

## 2023-02-20 DIAGNOSIS — M818 Other osteoporosis without current pathological fracture: Secondary | ICD-10-CM

## 2023-02-20 MED ORDER — DENOSUMAB 60 MG/ML ~~LOC~~ SOSY
60.0000 mg | PREFILLED_SYRINGE | Freq: Once | SUBCUTANEOUS | Status: AC
Start: 2023-02-20 — End: 2023-02-20
  Administered 2023-02-20: 60 mg via SUBCUTANEOUS
  Filled 2023-02-20: qty 1

## 2023-02-20 NOTE — Progress Notes (Signed)
 Diagnosis: Osteoporosis  Provider:  Chilton Greathouse MD  Procedure: Injection  Prolia (Denosumab), Dose: 60 mg, Site: subcutaneous, Number of injections: 1  Injection Site(s): Right arm  Post Care: Observation period completed  Discharge: Condition: Good, Destination: Home . AVS Provided  Performed by:  Forrest Moron, RN

## 2023-08-05 ENCOUNTER — Encounter: Payer: Self-pay | Admitting: Physician Assistant

## 2023-08-21 ENCOUNTER — Telehealth: Payer: Self-pay

## 2023-08-21 ENCOUNTER — Ambulatory Visit: Payer: Medicare Other

## 2023-08-21 VITALS — BP 149/82 | HR 62 | Temp 98.1°F | Resp 18 | Ht 66.0 in | Wt 129.2 lb

## 2023-08-21 DIAGNOSIS — M818 Other osteoporosis without current pathological fracture: Secondary | ICD-10-CM

## 2023-08-21 MED ORDER — DENOSUMAB 60 MG/ML ~~LOC~~ SOSY
60.0000 mg | PREFILLED_SYRINGE | Freq: Once | SUBCUTANEOUS | Status: AC
Start: 2023-08-21 — End: 2023-08-21
  Administered 2023-08-21: 60 mg via SUBCUTANEOUS
  Filled 2023-08-21: qty 1

## 2023-08-21 NOTE — Progress Notes (Signed)
 Diagnosis: Osteoporosis  Provider:  Chilton Greathouse MD  Procedure: Injection  Prolia (Denosumab), Dose: 60 mg, Site: subcutaneous, Number of injections: 1  Injection Site(s): Left arm  Post Care: Patient declined observation  Discharge: Condition: Good, Destination: Home . AVS Provided  Performed by:  Wyvonne Lenz, RN

## 2023-08-21 NOTE — Telephone Encounter (Signed)
 Called and spoke with Rosina, RN at Dr. Leita Fellows office at [facility] [phone]). Relayed that patient is scheduled for Prolia  injection today. Rosina confirmed that the patient had labwork collected on 03/17/2023, and that her calcium value was 9.3. Provided clinic fax number and Rosina stated she would fax a copy of the labs. Information provided to infusion clinical pharmacist Symphonie W., RPH.  Rocky FORBES Sar, RN

## 2023-09-02 NOTE — Progress Notes (Deleted)
 09/02/2023 Alexandra Rodriguez [id number] 1950/10/26  Referring provider: Stephane Leita DEL, MD Primary GI doctor: {[id number]}  ASSESSMENT AND PLAN:  Postprandial abdominal bloating/GERD 07/2014 ABUS unremarkable no gallstones normal liver EGD 2016 for similar symptoms with Candida esophagitis, normal duodenal biopsies Trial PPI, trial pancreatic supplement repeat EGD versus gastric emptying study No recent abdominal imaging  Screening colonoscopy  History of diverticulitis per patient No recent abdominal imaging  Patient Care Team: Stephane Leita DEL, MD as PCP - General (Internal Medicine)  HISTORY OF PRESENT ILLNESS: 73 y.o. female with a past medical history listed below presents as a new patient for evaluation of ***.   Patient previously seen by digestive health specialist Dr. Luis, last seen 08/19/2016 in their office for postprandial abdominal bloating. Labs reviewed from [facility] 08/21/2023 showed normal kidney function, normal liver function *** Discussed the use of AI scribe software for clinical note transcription with the patient, who gave verbal consent to proceed.  History of Present Illness            She  reports that she quit smoking about 41 years ago. Her smoking use included cigarettes. She started smoking about 51 years ago. She has a 7.5 pack-year smoking history. She has never used smokeless tobacco. She reports current alcohol use. She reports that she does not use drugs.  RELEVANT GI HISTORY, IMAGING AND LABS: Results          CBC    Component Value Date/Time   WBC 10.7 (H) 08/06/2021 0749   RBC 4.02 08/06/2021 0749   HGB 10.9 (L) 08/06/2021 0749   HCT 33.7 (L) 08/06/2021 0749   PLT 201 08/06/2021 0749   MCV 83.8 08/06/2021 0749   MCH 27.1 08/06/2021 0749   MCHC 32.3 08/06/2021 0749   RDW 13.8 08/06/2021 0749   LYMPHSABS 1.0 08/06/2021 0749   MONOABS 1.6 (H) 08/06/2021 0749   EOSABS 0.0 08/06/2021 0749   BASOSABS 0.0  08/06/2021 0749   No results for input(s): HGB in the last 8760 hours.  CMP     Component Value Date/Time   NA 137 08/06/2021 0749   K 2.8 (L) 08/06/2021 0749   CL 106 08/06/2021 0749   CO2 24 08/06/2021 0749   GLUCOSE 159 (H) 08/06/2021 0749   BUN 11 08/06/2021 0749   CREATININE 0.63 08/06/2021 0749   CREATININE 0.84 06/28/2011 1757   CALCIUM 7.7 (L) 08/06/2021 0749   PROT 7.3 06/28/2011 1757   ALBUMIN 4.3 06/28/2011 1757   AST 28 06/28/2011 1757   ALT 21 06/28/2011 1757   ALKPHOS 64 06/28/2011 1757   BILITOT 0.4 06/28/2011 1757   GFRNONAA >60 08/06/2021 0749      Latest Ref Rng & Units 06/28/2011    5:57 PM  Hepatic Function  Total Protein 6.0 - 8.3 g/dL 7.3   Albumin 3.5 - 5.2 g/dL 4.3   AST 0 - 37 U/L 28   ALT 0 - 35 U/L 21   Alk Phosphatase 39 - 117 U/L 64   Total Bilirubin 0.3 - 1.2 mg/dL 0.4       Current Medications:    Current Outpatient Medications (Cardiovascular):    hydrochlorothiazide (MICROZIDE) 12.5 MG capsule, Take 12.5 mg by mouth daily.  Current Outpatient Medications (Respiratory):    cetirizine (ZYRTEC) 10 MG tablet, Take 5 mg by mouth daily.   Current Outpatient Medications (Analgesics):    acetaminophen (TYLENOL) 650 MG CR tablet, Take 650 mg by mouth every 8 (eight) hours  as needed for pain.   Current Outpatient Medications (Other):    azithromycin  (ZITHROMAX ) 250 MG tablet, Take 1 tablet/day for 4 days.  Start first tablet tomorrow on 08/07/2021.   Cholecalciferol (VITAMIN D) 50 MCG (2000 UT) tablet, Take 2,000 Units by mouth daily.    Multiple Vitamin (MULTIVITAMIN) capsule, Take by mouth.  Medical History:  Past Medical History:  Diagnosis Date   Bronchitis    Bronchitis    Pneumonia    Allergies: No Known Allergies   Surgical History:  She  has a past surgical history that includes Tubal ligation and Tubal ligation. Family History:  Her family history includes Heart failure in her father; Thyroid disease in her  mother.  REVIEW OF SYSTEMS  : All other systems reviewed and negative except where noted in the History of Present Illness.  PHYSICAL EXAM: There were no vitals taken for this visit. Physical Exam          Alan JONELLE Coombs, PA-C 12:30 PM

## 2023-09-07 ENCOUNTER — Ambulatory Visit: Admitting: Physician Assistant

## 2023-11-03 ENCOUNTER — Encounter: Payer: Self-pay | Admitting: Gastroenterology

## 2023-11-03 ENCOUNTER — Ambulatory Visit (INDEPENDENT_AMBULATORY_CARE_PROVIDER_SITE_OTHER): Admitting: Gastroenterology

## 2023-11-03 VITALS — BP 110/70 | HR 76 | Ht 65.5 in | Wt 132.0 lb

## 2023-11-03 DIAGNOSIS — K580 Irritable bowel syndrome with diarrhea: Secondary | ICD-10-CM

## 2023-11-03 DIAGNOSIS — Z8719 Personal history of other diseases of the digestive system: Secondary | ICD-10-CM

## 2023-11-03 DIAGNOSIS — K219 Gastro-esophageal reflux disease without esophagitis: Secondary | ICD-10-CM | POA: Diagnosis not present

## 2023-11-03 DIAGNOSIS — K5730 Diverticulosis of large intestine without perforation or abscess without bleeding: Secondary | ICD-10-CM

## 2023-11-03 MED ORDER — DIPHENOXYLATE-ATROPINE 2.5-0.025 MG PO TABS: 1.0000 | ORAL_TABLET | Freq: Every day | ORAL | 2 refills | Status: AC

## 2023-11-03 NOTE — Patient Instructions (Addendum)
 GERD OTC Reflux gourmet    IBS Recommend low fod map diet IBgard samples   _______________________________________________________  If your blood pressure at your visit was 140/90 or greater, please contact your primary care physician to follow up on this.  _______________________________________________________  If you are age 71 or older, your body mass index should be between 23-30. Your Body mass index is 21.63 kg/m. If this is out of the aforementioned range listed, please consider follow up with your Primary Care Provider.  If you are age 59 or younger, your body mass index should be between 19-25. Your Body mass index is 21.63 kg/m. If this is out of the aformentioned range listed, please consider follow up with your Primary Care Provider.   ________________________________________________________  The [facility] providers would like to encourage you to use MYCHART to communicate with providers for non-urgent requests or questions.  Due to long hold times on the telephone, sending your provider a message by [facility] may be a faster and more efficient way to get a response.  Please allow 48 business hours for a response.  Please remember that this is for non-urgent requests.  _______________________________________________________  [facility] is using a team-based approach to care.  Your team is made up of your doctor and two to three APPS. Our APPS (Nurse Practitioners and Physician Assistants) work with your physician to ensure care continuity for you. They are fully qualified to address your health concerns and develop a treatment plan. They communicate directly with your gastroenterologist to care for you. Seeing the Advanced Practice Practitioners on your physician's team can help you by facilitating care more promptly, often allowing for earlier appointments, access to diagnostic testing, procedures, and other specialty referrals.    Thank you for trusting me with  your gastrointestinal care. Deanna May, FNP-C

## 2023-11-03 NOTE — Progress Notes (Signed)
 Chief Complaint:GERD, IBS-D, diverticulosis Primary GI Doctor: Dr. Charlanne  HPI:  Patient is a  73  year old female patient with past medical history of GERD, history of diverticulosis, and IBS, who was referred to me by Stephane Leita DEL, MD on 07/10/23 for a evaluation of GERD,diverticulosis, and IBS-D .    08/[facility], early satiety.   Interval History    Patient presents to establish care with new gastroenterologist. Patient has history of GERD and she uses OTC Tums as needed. Patient denies dysphagia.  Patient denies nausea, vomiting, or weight loss.   Patient has history of IBS with diarrhea which is managed with with 1 Lomotil po daily. She has been under added stress as of recently with her husband passing in 2024 and she is currently moving into new house.   Patient has history of diverticulosis. She had one episode of diverticulitis 10 years ago? No imaging on file. She noted episodes of lower abdominal pain that typically resolves on own. She takes OTC fiber po daily which helps.  Socially drinks alcohol. Nonsmoker.   She was previously on OTC Ibuprofen  for shoulder and hip pain thought to be from statins which has since then resolved. She had positive ANA marker, evaluated for autoimmune and negative workup.  Patient taking baby ASA 81 mg po daily.    Patients last colonoscopy in 2017, per patient normal.   Patients last EGD in 2016, per patient she had HH, but otherwise normal.  Patient's family history includes: father with diverticulitis    Wt Readings from Last 3 Encounters:  11/03/23 132 lb (59.9 kg)  08/21/23 129 lb 3.2 oz (58.6 kg)  02/20/23 136 lb 9.6 oz (62 kg)    Past Medical History:  Diagnosis Date   Autoimmune disorder    Bronchitis    Bronchitis    Calcification of artery    Diverticulitis    GERD (gastroesophageal reflux disease)    HH (hiatus hernia)    IBS (irritable bowel syndrome)    Osteoporosis     Pneumonia    Past Surgical History:  Procedure Laterality Date   TUBAL LIGATION      Current Outpatient Medications  Medication Sig Dispense Refill   Ashwagandha 500 MG CAPS Take 1 capsule by mouth daily.     aspirin EC 81 MG tablet Take 81 mg by mouth daily. Swallow whole.     Calcium Carb-Cholecalciferol (CALCIUM-VITAMIN D PO) Take 1 capsule by mouth daily.     cetirizine (ZYRTEC) 10 MG tablet Take 5 mg by mouth daily.      Cholecalciferol (VITAMIN D) 50 MCG (2000 UT) tablet Take 2,000 Units by mouth daily.      Coenzyme Q10 400 MG CAPS Take 1 capsule by mouth daily.     denosumab  (PROLIA ) 60 MG/ML SOSY injection Inject 60 mg into the skin every 6 (six) months.     DIGESTIVE ENZYMES PO Take 1 tablet by mouth daily.     hydrochlorothiazide (MICROZIDE) 12.5 MG capsule Take 12.5 mg by mouth daily.     Lutein 10 MG TABS Take 1 tablet by mouth daily.     magnesium chloride (SLOW-MAG) 64 MG TBEC SR tablet Take 1 tablet by mouth as needed.     Multiple Vitamin (MULTIVITAMIN) capsule Take by mouth.     Zinc 30 MG CAPS Take 1 capsule by mouth daily.     No current facility-administered medications for this visit.  Allergies as of 11/03/2023   (No Known Allergies)    Family History  Problem Relation Age of Onset   Thyroid disease Mother    Heart failure Father    Breast cancer Sister    Rheum arthritis Sister    Crohn's disease Son     Review of Systems:    Constitutional: No weight loss, fever, chills, weakness or fatigue HEENT: Eyes: No change in vision               Ears, Nose, Throat:  No change in hearing or congestion Skin: No rash or itching Cardiovascular: No chest pain, chest pressure or palpitations   Respiratory: No SOB or cough Gastrointestinal: See HPI and otherwise negative Genitourinary: No dysuria or change in urinary frequency Neurological: No headache, dizziness or syncope Musculoskeletal: No new muscle or joint pain Hematologic: No bleeding or  bruising Psychiatric: No history of depression or anxiety    Physical Exam:  Vital signs: BP 110/70 (BP Location: Left Arm, Patient Position: Sitting, Cuff Size: Normal)   Pulse 76   Ht 5' 5.5 (1.664 m) Comment: height measured without shoes  Wt 132 lb (59.9 kg)   BMI 21.63 kg/m   Constitutional:   Pleasant  female appears to be in NAD, Well developed, Well nourished, alert and cooperative Throat: Oral cavity and pharynx without inflammation, swelling or lesion.  Respiratory: Respirations even and unlabored. Lungs clear to auscultation bilaterally.   No wheezes, crackles, or rhonchi.  Cardiovascular: Normal S1, S2. Regular rate and rhythm. No peripheral edema, cyanosis or pallor.  Gastrointestinal:  Soft, nondistended, nontender. No rebound or guarding. Normal bowel sounds. No appreciable masses or hepatomegaly. Rectal:  Not performed.  Msk:  Symmetrical without gross deformities. Without edema, no deformity or joint abnormality.  Neurologic:  Alert and  oriented x4;  grossly normal neurologically.  Skin:   Dry and intact without significant lesions or rashes.  RELEVANT LABS AND IMAGING: CBC    Latest Ref Rng & Units 08/06/2021    7:49 AM 05/27/2013   10:18 AM 06/28/2011    5:57 PM  CBC  WBC 4.0 - 10.5 K/uL 10.7  4.4  4.6   Hemoglobin 12.0 - 15.0 g/dL 89.0  86.3  87.3   Hematocrit 36.0 - 46.0 % 33.7  42.3  38.3   Platelets 150 - 400 K/uL 201  273.0  264      CMP     Latest Ref Rng & Units 08/06/2021    7:49 AM 06/28/2011    5:57 PM  CMP  Glucose 70 - 99 mg/dL 840  868   BUN 8 - 23 mg/dL 11  19   Creatinine 9.55 - 1.00 mg/dL 9.36  9.15   Sodium 864 - 145 mmol/L 137  141   Potassium 3.5 - 5.1 mmol/L 2.8  4.1   Chloride 98 - 111 mmol/L 106  103   CO2 22 - 32 mmol/L 24  30   Calcium 8.9 - 10.3 mg/dL 7.7  9.6   Total Protein 6.0 - 8.3 g/dL  7.3   Total Bilirubin 0.3 - 1.2 mg/dL  0.4   Alkaline Phos 39 - 117 U/L  64   AST 0 - 37 U/L  28   ALT 0 - 35 U/L  21    EGD in 2016   with candida esophagitis, normal duodenal biopsies.  12/1984 sigmoidoscopy- normal  Assessment: Encounter Diagnoses  Name Primary?   Irritable bowel syndrome with diarrhea Yes   Gastroesophageal  reflux disease, unspecified whether esophagitis present    Diverticulosis of colon without hemorrhage     73 year old female patient who presents to establish care with gastroenterologist for history of IBS-D, GERD, and diverticular disease. Her GERD is managed with OTC antiacids. Recommended reflux gourmet as another option.    Patient reports her IBS-D is managed with 1 Lomotil po daily. Also recommended low fodmap diet and she can use ibgard prn for abdominal discomfort. Educated if the lomotil causes constipation she would need to stop it and notify us  immediately.     Reports history of diverticular disease? Reports issues ten years ago, however no recent imaging. Notes intermittent lower abdominal discomfort that resolves, could be related to IBS. Will request procedure records from Dr. Luis to review.   Plan: -Can use lomotil 1 tablet po daily prn, refilled. We discussed stopping medication immediately if constipation occurs.  -recommend low fod map diet -IBgard samples provided   -continue fiber supplement po daily  -otc reflux gourmet prn -request results from Dr. Luis on procedures  Thank you for the courtesy of this consult. Please call me with any questions or concerns.   Vonnie Ligman, FNP-C [facility] 11/03/2023, 11:40 AM  Cc: Stephane Leita DEL, MD

## 2024-02-22 ENCOUNTER — Ambulatory Visit: Admitting: *Deleted

## 2024-02-22 ENCOUNTER — Telehealth: Payer: Self-pay

## 2024-02-22 VITALS — BP 149/90 | HR 76 | Temp 97.7°F | Resp 16 | Ht 65.0 in | Wt 134.4 lb

## 2024-02-22 DIAGNOSIS — M818 Other osteoporosis without current pathological fracture: Secondary | ICD-10-CM

## 2024-02-22 MED ORDER — DENOSUMAB 60 MG/ML ~~LOC~~ SOSY
60.0000 mg | PREFILLED_SYRINGE | Freq: Once | SUBCUTANEOUS | Status: AC
  Administered 2024-02-22: 60 mg via SUBCUTANEOUS
  Filled 2024-02-22: qty 1

## 2024-02-22 NOTE — Progress Notes (Signed)
 Diagnosis:, Osteoporosis  Provider:  Mannam, Praveen MD  Procedure: Injection  Prolia  (Denosumab ), Dose: 60 mg, Site: subcutaneous, Number of injections: 1  Injection Site(s): Left arm  Post Care: Observation period completed  Discharge: Condition: Good, Destination: Home . AVS Declined  Performed by:  Trudy Lamarr LABOR, RN

## 2024-02-22 NOTE — Telephone Encounter (Signed)
 Auth Submission: NO AUTH NEEDED Site of care: Site of care: [facility] Payer: Medicare A/B with BCBS supplement Medication & CPT/J Code(s) submitted: Prolia  (Denosumab ) [id number] Diagnosis Code:  Route of submission (phone, fax, portal):  Phone # Fax # Auth type: Buy/Bill PB Units/visits requested: 60mg  x 2 doses Reference number:  Approval from: 02/22/24 to 03/05/25   Patient lives closer to [facility] and wants to stay there.

## 2024-08-22 ENCOUNTER — Ambulatory Visit
